# Patient Record
Sex: Male | Born: 1970 | Race: Asian | Hispanic: No | Marital: Married | Smoking: Former smoker
Health system: Southern US, Community
[De-identification: ages and names within clinical notes are randomized; demographics above are authoritative.]

## PROBLEM LIST (undated history)

## (undated) DIAGNOSIS — Z87442 Personal history of urinary calculi: Secondary | ICD-10-CM

## (undated) DIAGNOSIS — G8929 Other chronic pain: Secondary | ICD-10-CM

## (undated) DIAGNOSIS — I10 Essential (primary) hypertension: Secondary | ICD-10-CM

## (undated) DIAGNOSIS — E079 Disorder of thyroid, unspecified: Secondary | ICD-10-CM

## (undated) DIAGNOSIS — E785 Hyperlipidemia, unspecified: Secondary | ICD-10-CM

## (undated) DIAGNOSIS — N201 Calculus of ureter: Secondary | ICD-10-CM

## (undated) DIAGNOSIS — K649 Unspecified hemorrhoids: Secondary | ICD-10-CM

## (undated) DIAGNOSIS — M2550 Pain in unspecified joint: Secondary | ICD-10-CM

## (undated) HISTORY — DX: Hyperlipidemia, unspecified: E78.5

## (undated) HISTORY — PX: FINGER SURGERY: SHX640

---

## 2007-12-21 ENCOUNTER — Ambulatory Visit: Payer: Self-pay | Admitting: Nurse Practitioner

## 2007-12-21 DIAGNOSIS — N200 Calculus of kidney: Secondary | ICD-10-CM | POA: Insufficient documentation

## 2007-12-21 DIAGNOSIS — K649 Unspecified hemorrhoids: Secondary | ICD-10-CM | POA: Insufficient documentation

## 2007-12-21 LAB — CONVERTED CEMR LAB
ALT: 31 units/L (ref 0–53)
Albumin: 4.6 g/dL (ref 3.5–5.2)
Basophils Absolute: 0 10*3/uL (ref 0.0–0.1)
Bilirubin Urine: NEGATIVE
CO2: 26 meq/L (ref 19–32)
Eosinophils Relative: 9 % — ABNORMAL HIGH (ref 0–5)
Glucose, Bld: 77 mg/dL (ref 70–99)
Glucose, Urine, Semiquant: NEGATIVE
HCT: 41.8 % (ref 39.0–52.0)
Lymphocytes Relative: 33 % (ref 12–46)
Lymphs Abs: 1.9 10*3/uL (ref 0.7–4.0)
Neutro Abs: 2.7 10*3/uL (ref 1.7–7.7)
Neutrophils Relative %: 48 % (ref 43–77)
Platelets: 290 10*3/uL (ref 150–400)
Potassium: 4.1 meq/L (ref 3.5–5.3)
Protein, U semiquant: NEGATIVE
RDW: 12.7 % (ref 11.5–15.5)
Sodium: 140 meq/L (ref 135–145)
Specific Gravity, Urine: <1.005
TSH: 3.176 microintl units/mL (ref 0.350–5.50)
Total Bilirubin: 0.7 mg/dL (ref 0.3–1.2)
Total Protein: 8 g/dL (ref 6.0–8.3)
Urobilinogen, UA: 0.2
WBC Urine, dipstick: NEGATIVE
WBC: 5.5 10*3/uL (ref 4.0–10.5)
pH: 6.5

## 2007-12-25 ENCOUNTER — Encounter (INDEPENDENT_AMBULATORY_CARE_PROVIDER_SITE_OTHER): Payer: Self-pay | Admitting: Nurse Practitioner

## 2007-12-26 ENCOUNTER — Ambulatory Visit (HOSPITAL_COMMUNITY): Admission: RE | Admit: 2007-12-26 | Discharge: 2007-12-26 | Payer: Self-pay | Admitting: Internal Medicine

## 2008-01-04 ENCOUNTER — Ambulatory Visit: Payer: Self-pay | Admitting: Nurse Practitioner

## 2008-01-08 ENCOUNTER — Ambulatory Visit (HOSPITAL_COMMUNITY): Admission: RE | Admit: 2008-01-08 | Discharge: 2008-01-08 | Payer: Self-pay | Admitting: Internal Medicine

## 2008-01-12 ENCOUNTER — Encounter (INDEPENDENT_AMBULATORY_CARE_PROVIDER_SITE_OTHER): Payer: Self-pay | Admitting: Nurse Practitioner

## 2008-01-18 ENCOUNTER — Ambulatory Visit: Payer: Self-pay | Admitting: Nurse Practitioner

## 2008-02-01 ENCOUNTER — Encounter (INDEPENDENT_AMBULATORY_CARE_PROVIDER_SITE_OTHER): Payer: Self-pay | Admitting: Nurse Practitioner

## 2008-02-02 ENCOUNTER — Encounter (INDEPENDENT_AMBULATORY_CARE_PROVIDER_SITE_OTHER): Payer: Self-pay | Admitting: Nurse Practitioner

## 2008-02-13 ENCOUNTER — Emergency Department (HOSPITAL_COMMUNITY): Admission: EM | Admit: 2008-02-13 | Discharge: 2008-02-13 | Payer: Self-pay | Admitting: Emergency Medicine

## 2008-02-15 ENCOUNTER — Ambulatory Visit (HOSPITAL_COMMUNITY): Admission: RE | Admit: 2008-02-15 | Discharge: 2008-02-15 | Payer: Self-pay | Admitting: Urology

## 2008-02-15 HISTORY — PX: OTHER SURGICAL HISTORY: SHX169

## 2008-03-19 ENCOUNTER — Ambulatory Visit: Payer: Self-pay | Admitting: Nurse Practitioner

## 2008-03-19 DIAGNOSIS — M25519 Pain in unspecified shoulder: Secondary | ICD-10-CM | POA: Insufficient documentation

## 2008-03-19 DIAGNOSIS — M25539 Pain in unspecified wrist: Secondary | ICD-10-CM

## 2008-03-19 DIAGNOSIS — K623 Rectal prolapse: Secondary | ICD-10-CM

## 2008-04-11 ENCOUNTER — Ambulatory Visit (HOSPITAL_COMMUNITY): Admission: RE | Admit: 2008-04-11 | Discharge: 2008-04-11 | Payer: Self-pay | Admitting: Urology

## 2008-04-19 ENCOUNTER — Ambulatory Visit: Payer: Self-pay | Admitting: Nurse Practitioner

## 2008-04-19 DIAGNOSIS — M6281 Muscle weakness (generalized): Secondary | ICD-10-CM

## 2008-04-19 LAB — CONVERTED CEMR LAB
BUN: 19 mg/dL (ref 6–23)
Basophils Absolute: 0 10*3/uL (ref 0.0–0.1)
Basophils Relative: 0 % (ref 0–1)
CRP: 0.1 mg/dL (ref <0.6)
Creatinine, Ser: 1.08 mg/dL (ref 0.40–1.50)
Eosinophils Relative: 6 % — ABNORMAL HIGH (ref 0–5)
HCT: 43.2 % (ref 39.0–52.0)
Hemoglobin: 14.5 g/dL (ref 13.0–17.0)
MCHC: 33.6 g/dL (ref 30.0–36.0)
Monocytes Absolute: 0.3 10*3/uL (ref 0.1–1.0)
Neutro Abs: 2.3 10*3/uL (ref 1.7–7.7)
Platelets: 288 10*3/uL (ref 150–400)
RDW: 12.7 % (ref 11.5–15.5)
Rhuematoid fact SerPl-aCnc: <20 intl units/mL (ref 0–20)
Sed Rate: 9 mm/hr (ref 0–16)

## 2008-04-21 ENCOUNTER — Ambulatory Visit (HOSPITAL_COMMUNITY): Admission: RE | Admit: 2008-04-21 | Discharge: 2008-04-21 | Payer: Self-pay | Admitting: Internal Medicine

## 2008-05-01 ENCOUNTER — Ambulatory Visit (HOSPITAL_BASED_OUTPATIENT_CLINIC_OR_DEPARTMENT_OTHER): Admission: RE | Admit: 2008-05-01 | Discharge: 2008-05-01 | Payer: Self-pay | Admitting: Urology

## 2008-05-01 HISTORY — PX: OTHER SURGICAL HISTORY: SHX169

## 2008-06-07 ENCOUNTER — Telehealth (INDEPENDENT_AMBULATORY_CARE_PROVIDER_SITE_OTHER): Payer: Self-pay | Admitting: Nurse Practitioner

## 2008-06-07 ENCOUNTER — Encounter (INDEPENDENT_AMBULATORY_CARE_PROVIDER_SITE_OTHER): Payer: Self-pay | Admitting: Nurse Practitioner

## 2008-09-18 ENCOUNTER — Emergency Department (HOSPITAL_COMMUNITY): Admission: EM | Admit: 2008-09-18 | Discharge: 2008-09-18 | Payer: Self-pay | Admitting: Emergency Medicine

## 2008-09-26 ENCOUNTER — Encounter (INDEPENDENT_AMBULATORY_CARE_PROVIDER_SITE_OTHER): Payer: Self-pay | Admitting: Nurse Practitioner

## 2008-10-21 ENCOUNTER — Encounter (INDEPENDENT_AMBULATORY_CARE_PROVIDER_SITE_OTHER): Payer: Self-pay | Admitting: Nurse Practitioner

## 2010-06-20 ENCOUNTER — Encounter (INDEPENDENT_AMBULATORY_CARE_PROVIDER_SITE_OTHER): Payer: Self-pay | Admitting: Internal Medicine

## 2010-12-13 HISTORY — PX: RECTAL PROLAPSE REPAIR, RECTOPEXY: SHX2311

## 2011-01-12 NOTE — Miscellaneous (Signed)
Summary: Rectopexy documentation  Clinical Lists Changes  Observations: Added new observation of PAST SURG HX: 1.  10/16/08:  Rectopexy for rectoprolapse.  Dr. Durenda Hurt, [facility] (06/20/2010 6:19)        Past History:  Past Surgical History: 1.  10/16/08:  Rectopexy for rectoprolapse.  Dr. Durenda Hurt, [facility]

## 2011-01-12 NOTE — Letter (Signed)
Summary: Discharge Summary  Discharge Summary   Imported By: Arta Bruce 08/04/2010 11:31:08  _____________________________________________________________________  External Attachment:    Type:   Image     Comment:   External Document

## 2011-04-27 NOTE — Op Note (Signed)
NAMECHANSON, TEEMS NO.:  [id number]   MEDICAL RECORD NO.:  [id number]          PATIENT TYPE:  AMB   LOCATION:  DAY                          FACILITY:  [facility]   PHYSICIAN:  Valetta Fuller, M.D.  DATE OF BIRTH:  [birth year]   DATE OF PROCEDURE:  DATE OF DISCHARGE:                               OPERATIVE REPORT   PREOPERATIVE DIAGNOSIS:  Bilateral ureteral calculi.   POSTOPERATIVE DIAGNOSIS:  Bilateral ureteral calculi.   PROCEDURE PERFORMED:  Cystoscopy, bilateral retrograde pyelography,  right ureteroscopy, right holmium laser lithotripsy, bilateral double-J  stent placement.   SURGEON:  Dr. Barron Alvine.   ANESTHESIA:  General.   INDICATIONS:  Alan Cervantes is a 40 year old male.  He presented recently  as a consultation from [employer]. He was originally  from the country of [place] and had been living in a refugee camp in  [place]. His English is somewhat limited.  It appears that he has been  having some bilateral flank discomfort for several months now.  The  patient did have a CT scan done about 6-8 weeks ago which showed  multiple right distal ureteral calculi with a stone measuring 7 mm and 3  mm.  There was some dilation, but no evidence of really high-grade  obstruction.  On the left side, the patient appeared to have a partially  duplicated system with a question of a 3-4 mm stone in the left mid  ureter. There also appeared to be some ureteral wall thickening.  The  patient continued to have intermittent complaints of discomfort.  Most  of his pain recently has been on the right side.  We felt that these  stones had been probably present for a long period of time.  We felt  that at this point  it was a prudent to go ahead and intervene with his  situation.  Renal function studies were within normal limits.  The  patient appeared to understand the advantages and disadvantages of this  type of surgery and we felt this was the prudent  thing to do for him.   TECHNIQUE AND FINDINGS:  The patient was brought to the operating room.  Prior to going back, he had told me that he had had to go back to the  emergency room because of some severe right-sided flank discomfort.  He  had successful induction of general endotracheal anesthesia and was  placed in lithotomy position.  He was prepped and draped in the usual  manner.  Cystoscopy revealed an unremarkable urethra, prostatic urethra.  One could see some edema on the right hemi trigone and evidence of some  chronic inflammation.  Retrograde pyelogram was done with an 8-French  cone-tip catheter.  Fluoroscopic guidance and interpretation was  utilized.  One could see multiple filling defects in the distal right  ureter with dilation proximal to that and a solitary system on that  side.  A guidewire was placed up to the right renal pelvis.  A short  rigid ureteroscope was then engaged in the distal ureter without the  need for dilation.  A  large stone was encountered which appeared to be  impacted in the intramural ureter.  Holmium laser lithotriptor was used  to break the stone into approximately a dozen pieces which were all  flushed from the distal ureter.  A smaller 3-4 mm stone was also  encountered which also underwent laser lithotripsy.  At the completion  of the procedure, there was quite a bit of mucosal edema in the area  where the stone had been impacted but no residual fragments.  A 6-French  24 cm stent was then placed with fluoroscopic as well as visual  guidance.   Attention was then turned towards the left side.  Left retrograde  pyelogram was performed.  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  I saw no  evidence of any high-grade obstruction.  In the mid to proximal ureter  in the upper pole moiety of the duplicated system, there was a small  filling defect with a questionable 3 mm stone in  that location.  The  ureter otherwise appeared to be quite delicate.  I felt this stone would  probably be best treated with stent placement to allow for some natural  dilation and then hopefully spontaneous passage given his relatively  small size but upper location especially given the duplicated system.  A  guidewire was placed up to the upper pole moiety and another 6-French 24  cm double-J stent was placed.  Our plan would be to leave those  indwelling for about 2 weeks and then removed both stents.  Again the  right side appears to be cleared of stones, in the left there may still  be a small stone in the proximal ureter.  The patient was brought to the  recovery room in stable condition without any obvious complications or  problems.           ______________________________  Valetta Fuller, M.D.  Electronically Signed     DSG/MEDQ  D:  02/15/2008  T:  02/15/2008  Job:  [id number]   cc:   Melvern Banker  Fax: [phone]

## 2011-04-27 NOTE — Op Note (Signed)
NAMELAM, MCCUBBINS NO.:  [id number]   MEDICAL RECORD NO.:  [id number]          PATIENT TYPE:  AMB   LOCATION:  [facility]                         [facility]AC[facility]   PHYSICIAN:  Valetta Fuller, M.D.  DATE OF BIRTH:  06/07/1971   DATE OF PROCEDURE:  05/01/2008  DATE OF DISCHARGE:                               OPERATIVE REPORT   PREOPERATIVE DIAGNOSIS:  Is a left ureteral calculus.   POSTOPERATIVE DIAGNOSIS:  Is a left ureteral calculus.   PROCEDURE PERFORMED:  Was cystoscopy, left retrograde pyelogram, left  ureteroscopy with stone basketing.   SURGEON:  Valetta Fuller, M.D.   ANESTHESIA:  General.   INDICATION:  The patient has had a complicated urologic history over the  last several weeks last month.  The patient had presented with multiple  stones in his right distal ureter and a fair amount of hydronephrosis on  the right side.  Previous imaging had also raised a question of a  duplicated left collecting system with a left-sided ureteral stone as  well.  The patient was taken to surgery several weeks back and had  successful ureteroscopy with laser treatment of his right distal  ureteral calculi.  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  The patient subsequently came back for follow-up and was doing well  clinically.  He was, however, complaining of some ongoing intermittent  left-sided flank discomfort.  For that reason, he had a repeat CT scan  performed.  This showed resolution of the right-sided calculi.  The  previous noted right hydronephrosis had resolved.  The patient did,  however, have continued presence of a small stone in his left ureter and  also a small stone in the lower pole of his left kidney.  At this point,  the patient was felt to require intervention since a sufficient amount  of time  had passed for spontaneous passage.  The patient elected to  proceed with intervention, and full informed consent was obtained.   TECHNIQUE AND FINDINGS:  The patient was brought to the operating room  where he had successful induction of general anesthesia.  He was placed  in lithotomy position and prepped and draped in usual manner.  Cystoscopy revealed unremarkable anterior and prostatic urethra as well  as bladder.   Retrograde pyelogram was done with an 8-French cone-tip catheter with  fluoroscopic interpretation.  The patient again was noted to have a  partially duplicated system approximately half way up the ureter.  I  could see no definitive evidence of high-grade obstruction or obvious  filling defects.  At the completion of the retrograde pyelogram, a  guidewire was placed up to the left renal pelvis.   The cystoscope was removed, and a rigid ureteroscope was engaged in the  distal ureter without the need for any dilation.  Halfway up the ureter,  there was a bifurcation.  Ureteroscopy of both moieties was performed.  We did find a  somewhat impacted 4 mm stone in what appeared to be the  upper pole moiety of that kidney.  This was then basket extracted  without difficulty.  There did not appear to be a severe amount of  ureteral edema, and the entire  procedure was done quite atraumatically.  We felt that there was no  definitive need for double-J stent placement.  The patient had lidocaine  jelly instilled per urethra.  He appeared to tolerate the procedure  well.  There were no obvious complications, and he was brought to  recovery room in stable condition.           ______________________________  Valetta Fuller, M.D.  Electronically Signed     DSG/MEDQ  D:  05/01/2008  T:  05/01/2008  Job:  [id number]

## 2011-09-08 LAB — POCT HEMOGLOBIN-HEMACUE: Hemoglobin: 15.4

## 2011-09-13 LAB — COMPREHENSIVE METABOLIC PANEL
ALT: 21
AST: 22
Albumin: 4.2
Chloride: 103
Creatinine, Ser: 1
Potassium: 4
Sodium: 138
Total Bilirubin: 0.8

## 2011-09-13 LAB — MALARIA SMEAR

## 2011-09-13 LAB — CBC
HCT: 44
Platelets: 248
RDW: 12.9

## 2011-09-13 LAB — DIFFERENTIAL
Basophils Relative: 1
Monocytes Absolute: 0.4
Monocytes Relative: 8
Neutro Abs: 2.9

## 2011-09-13 LAB — CULTURE, BLOOD (ROUTINE X 2): Culture: NO GROWTH

## 2013-08-05 ENCOUNTER — Encounter (HOSPITAL_COMMUNITY): Payer: Self-pay | Admitting: Emergency Medicine

## 2013-08-05 ENCOUNTER — Emergency Department (INDEPENDENT_AMBULATORY_CARE_PROVIDER_SITE_OTHER)
Admission: EM | Admit: 2013-08-05 | Discharge: 2013-08-05 | Disposition: A | Discharge status: Home / Self Care | Payer: Self-pay | Source: Home / Self Care | Attending: Emergency Medicine | Admitting: Emergency Medicine

## 2013-08-05 DIAGNOSIS — J029 Acute pharyngitis, unspecified: Secondary | ICD-10-CM

## 2013-08-05 HISTORY — DX: Other chronic pain: G89.29

## 2013-08-05 HISTORY — DX: Pain in unspecified joint: M25.50

## 2013-08-05 LAB — POCT RAPID STREP A: Streptococcus, Group A Screen (Direct): NEGATIVE

## 2013-08-05 MED ORDER — HYDROCODONE-ACETAMINOPHEN 5-325 MG PO TABS: 2.0000 | ORAL_TABLET | ORAL | Status: DC | PRN

## 2013-08-05 MED ORDER — PENICILLIN V POTASSIUM 500 MG PO TABS: 500.0000 mg | ORAL_TABLET | Freq: Four times a day (QID) | ORAL | Status: AC

## 2013-08-05 NOTE — ED Provider Notes (Signed)
Medical screening examination/treatment/procedure(s) were performed by non-physician practitioner and as supervising physician I was immediately available for consultation/collaboration.  Leslee Home, M.D.  Reuben Likes, MD 08/05/13 [phone]

## 2013-08-05 NOTE — ED Notes (Signed)
Reports sore throat and fever since friday

## 2013-08-05 NOTE — ED Provider Notes (Signed)
  CSN: [id number]     Arrival date & time 08/05/13  4098 History     None    Chief Complaint  Patient presents with  . Fever  . Sore Throat   (Consider location/radiation/quality/duration/timing/severity/associated sxs/prior Treatment) Patient is a 42 y.o. male presenting with fever and pharyngitis. The history is provided by the patient. No language interpreter was used.  Fever Severity:  Moderate Onset quality:  Sudden Timing:  Constant Progression:  Worsening Chronicity:  New Relieved by:  Nothing Worsened by:  Nothing tried Ineffective treatments:  None tried Associated symptoms: no chest pain   Sore Throat Pertinent negatives include no chest pain.  Pt complains of a sorethroat.  Pt has trouble swallowing.    Past Medical History  Diagnosis Date  . Malaria   . Chronic joint pain    Past Surgical History  Procedure Laterality Date  . Hemorrhoid surgery    . Kidney stone surgery     No family history on file. History  Substance Use Topics  . Smoking status: Current Some Day Smoker  . Smokeless tobacco: Not on file  . Alcohol Use: Yes    Review of Systems  Constitutional: Positive for fever.  Cardiovascular: Negative for chest pain.  All other systems reviewed and are negative.    Allergies  Review of patient's allergies indicates no known allergies.  Home Medications   Current Outpatient Rx  Name  Route  Sig  Dispense  Refill  . ibuprofen (ADVIL,MOTRIN) 200 MG tablet   Oral   Take 200 mg by mouth every 6 (six) hours as needed for pain.          BP 142/90  Pulse 83  Temp(Src) 98.8 F (37.1 C) (Oral)  SpO2 98% Physical Exam  Nursing note and vitals reviewed. Constitutional: He is oriented to person, place, and time. He appears well-developed and well-nourished.  HENT:  Head: Normocephalic and atraumatic.  Right Ear: External ear normal.  Left Ear: External ear normal.  Nose: Nose normal.  Mouth/Throat: Oropharynx is clear and moist.   Eyes: EOM are normal. Pupils are equal, round, and reactive to light.  Neck: Normal range of motion.  Cardiovascular: Normal rate.   Pulmonary/Chest: Effort normal.  Musculoskeletal: Normal range of motion.  Neurological: He is alert and oriented to person, place, and time. He has normal reflexes.  Skin: Skin is warm.  Psychiatric: He has a normal mood and affect.    ED Course   Procedures (including critical care time)  Labs Reviewed - No data to display No results found. No diagnosis found.  MDM  [id number]  qid and hydrocodone   Lonia Skinner [place], PA-C 08/05/13 1106

## 2013-08-07 LAB — CULTURE, GROUP A STREP

## 2014-02-16 ENCOUNTER — Emergency Department (INDEPENDENT_AMBULATORY_CARE_PROVIDER_SITE_OTHER)
Admission: EM | Admit: 2014-02-16 | Discharge: 2014-02-16 | Disposition: A | Discharge status: Home / Self Care | Payer: Self-pay | Source: Home / Self Care | Attending: Family Medicine | Admitting: Family Medicine

## 2014-02-16 ENCOUNTER — Encounter (HOSPITAL_COMMUNITY): Payer: Self-pay | Admitting: Emergency Medicine

## 2014-02-16 DIAGNOSIS — J329 Chronic sinusitis, unspecified: Secondary | ICD-10-CM

## 2014-02-16 LAB — POCT RAPID STREP A: Streptococcus, Group A Screen (Direct): NEGATIVE

## 2014-02-16 MED ORDER — FLUTICASONE PROPIONATE 50 MCG/ACT NA SUSP: 2.0000 | Freq: Two times a day (BID) | NASAL | Status: DC

## 2014-02-16 MED ORDER — PREDNISONE 10 MG PO TABS: ORAL_TABLET | ORAL | Status: DC

## 2014-02-16 MED ORDER — AMOXICILLIN-POT CLAVULANATE 875-125 MG PO TABS: 1.0000 | ORAL_TABLET | Freq: Two times a day (BID) | ORAL | Status: DC

## 2014-02-16 NOTE — Discharge Instructions (Signed)

## 2014-02-16 NOTE — ED Provider Notes (Signed)
CSN: [id number]     Arrival date & time 02/16/14  0930 History   First MD Initiated Contact with Patient 02/16/14 [phone]     Chief Complaint  Patient presents with  . Sore Throat   (Consider location/radiation/quality/duration/timing/severity/associated sxs/prior Treatment) HPI Comments: 43 year old male presents complaining of sore throat, fever, nasal congestion, cough. He started with the coughing, nasal congestion, runny nose, and sinus pressure approximately 2 weeks ago. The sore throat just started 2 days ago and has been getting progressively worse. He has been taking over-the-counter medications without relief. The fever has been subjective, not measured. No NVD, shortness of breath, pleuritic chest pain. No recent travel or sick contacts.  Patient is a 43 y.o. male presenting with pharyngitis.  Sore Throat Pertinent negatives include no chest pain, no abdominal pain and no shortness of breath.    Past Medical History  Diagnosis Date  . Malaria   . Chronic joint pain    Past Surgical History  Procedure Laterality Date  . Hemorrhoid surgery    . Kidney stone surgery     History reviewed. No pertinent family history. History  Substance Use Topics  . Smoking status: Current Some Day Smoker  . Smokeless tobacco: Not on file  . Alcohol Use: Yes    Review of Systems  Constitutional: Positive for fever. Negative for fatigue.  HENT: Positive for congestion, postnasal drip, rhinorrhea, sinus pressure and sore throat. Negative for ear pain.   Eyes: Negative for visual disturbance.  Respiratory: Positive for cough. Negative for shortness of breath.   Cardiovascular: Negative for chest pain, palpitations and leg swelling.  Gastrointestinal: Negative for nausea, vomiting, abdominal pain, diarrhea and constipation.  Genitourinary: Negative for dysuria, urgency, frequency and hematuria.  Musculoskeletal: Negative for arthralgias, myalgias, neck pain and neck stiffness.  Skin: Negative  for rash.  Neurological: Negative for dizziness, weakness and light-headedness.    Allergies  Review of patient's allergies indicates no known allergies.  Home Medications   Current Outpatient Rx  Name  Route  Sig  Dispense  Refill  . ibuprofen (ADVIL,MOTRIN) 200 MG tablet   Oral   Take 200 mg by mouth every 6 (six) hours as needed for pain.         Marland Kitchen amoxicillin-clavulanate (AUGMENTIN) 875-125 MG per tablet   Oral   Take 1 tablet by mouth every 12 (twelve) hours.   14 tablet   0   . fluticasone (FLONASE) 50 MCG/ACT nasal spray   Each Nare   Place 2 sprays into both nostrils 2 (two) times daily.   16 g   2   . HYDROcodone-acetaminophen (NORCO/VICODIN) 5-325 MG per tablet   Oral   Take 2 tablets by mouth every 4 (four) hours as needed for pain.   10 tablet   0   . predniSONE (DELTASONE) 10 MG tablet      4 tabs PO QD for 4 days; 3 tabs PO QD for 3 days; 2 tabs PO QD for 2 days; 1 tab PO QD for 1 day   30 tablet   0    BP 138/98  Pulse 78  Temp(Src) 98.5 F (36.9 C) (Oral)  Resp 16  SpO2 100% Physical Exam  Nursing note and vitals reviewed. Constitutional: He is oriented to person, place, and time. He appears well-developed and well-nourished. No distress.  HENT:  Head: Normocephalic and atraumatic.  Right Ear: Tympanic membrane, external ear and ear canal normal.  Left Ear: External ear and ear canal normal. Tympanic  membrane is injected.  Nose: Mucosal edema and rhinorrhea present. Right sinus exhibits frontal sinus tenderness. Right sinus exhibits no maxillary sinus tenderness. Left sinus exhibits frontal sinus tenderness. Left sinus exhibits no maxillary sinus tenderness.  Mouth/Throat: Uvula is midline, oropharynx is clear and moist and mucous membranes are normal.  Neck: Normal range of motion. Neck supple.  Cardiovascular: Normal rate, regular rhythm and normal heart sounds.  Exam reveals no gallop and no friction rub.   No murmur  heard. Pulmonary/Chest: Effort normal and breath sounds normal. No respiratory distress. He has no wheezes. He has no rales.  Lymphadenopathy:       Head (left side): Occipital adenopathy present.    He has cervical adenopathy.       Right cervical: Superficial cervical adenopathy present.       Left cervical: Superficial cervical adenopathy present.  Neurological: He is alert and oriented to person, place, and time. Coordination normal.  Skin: Skin is warm and dry. No rash noted. He is not diaphoretic.  Psychiatric: He has a normal mood and affect. Judgment normal.    ED Course  Procedures (including critical care time) Labs Review Labs Reviewed  POCT RAPID STREP A ([facility] CARE ONLY)   Imaging Review No results found.   MDM   1. Sinusitis    Probable sinusitis versus uri.  Symptoms going on for 2 weeks and worsening so will treat with ABx.  Also treat with prednisone and flonase, continue OTC meds PRN.  F/u if not improving.    Meds ordered this encounter  Medications  . amoxicillin-clavulanate (AUGMENTIN) 875-125 MG per tablet    Sig: Take 1 tablet by mouth every 12 (twelve) hours.    Dispense:  14 tablet    Refill:  0    Order Specific Question:  Supervising Provider    Answer:  Linna HoffKINDL, JAMES D [phone][[id number]]  . predniSONE (DELTASONE) 10 MG tablet    Sig: 4 tabs PO QD for 4 days; 3 tabs PO QD for 3 days; 2 tabs PO QD for 2 days; 1 tab PO QD for 1 day    Dispense:  30 tablet    Refill:  0    Order Specific Question:  Supervising Provider    Answer:  Linna HoffKINDL, JAMES D [phone][[id number]]  . fluticasone (FLONASE) 50 MCG/ACT nasal spray    Sig: Place 2 sprays into both nostrils 2 (two) times daily.    Dispense:  16 g    Refill:  2    Order Specific Question:  Supervising Provider    Answer:  Bradd CanaryKINDL, JAMES D [[id number]]       Graylon GoodZachary H Giorgi Debruin, PA-C 02/16/14 1034

## 2014-02-16 NOTE — ED Provider Notes (Signed)
Medical screening examination/treatment/procedure(s) were performed by resident physician or non-physician practitioner and as supervising physician I was immediately available for consultation/collaboration.   Roberts Bon DOUGLAS MD.   Myan Locatelli D Jakaylah Schlafer, MD 02/16/14 1453 

## 2014-02-16 NOTE — ED Notes (Signed)
C/o  Severe sore throat x 2 days.  States had cold symptoms prior:  Cough.  Fever.  Stuffy nose.  Headaches.   Denies n/v/d.   Mild relief with otc meds.

## 2014-02-18 LAB — CULTURE, GROUP A STREP

## 2014-04-24 ENCOUNTER — Emergency Department (INDEPENDENT_AMBULATORY_CARE_PROVIDER_SITE_OTHER)
Admission: EM | Admit: 2014-04-24 | Discharge: 2014-04-24 | Disposition: A | Discharge status: Home / Self Care | Payer: Self-pay | Source: Home / Self Care | Attending: Emergency Medicine | Admitting: Emergency Medicine

## 2014-04-24 ENCOUNTER — Encounter (HOSPITAL_COMMUNITY): Payer: Self-pay | Admitting: Emergency Medicine

## 2014-04-24 DIAGNOSIS — K121 Other forms of stomatitis: Secondary | ICD-10-CM

## 2014-04-24 DIAGNOSIS — K1370 Unspecified lesions of oral mucosa: Secondary | ICD-10-CM

## 2014-04-24 DIAGNOSIS — J029 Acute pharyngitis, unspecified: Secondary | ICD-10-CM

## 2014-04-24 LAB — POCT RAPID STREP A: STREPTOCOCCUS, GROUP A SCREEN (DIRECT): NEGATIVE

## 2014-04-24 MED ORDER — CETIRIZINE HCL 10 MG PO TABS: 10.0000 mg | ORAL_TABLET | Freq: Every day | ORAL | Status: DC

## 2014-04-24 MED ORDER — MAGIC MOUTHWASH W/LIDOCAINE: 5.0000 mL | Freq: Three times a day (TID) | ORAL | Status: DC | PRN

## 2014-04-24 NOTE — ED Notes (Signed)
Pt    Reports  Symptoms  Of  sorethroat  woith  Pain  When  He  Swallows  With  Some  Swelling to  l  Side  Neck         Pt  Sitting upright on the  Exam table  Speaking in  Complete  sentances

## 2014-04-24 NOTE — ED Provider Notes (Signed)
CSN: [id number]     Arrival date & time 04/24/14  0807 History   First MD Initiated Contact with Patient 04/24/14 [id number]     No chief complaint on file.  (Consider location/radiation/quality/duration/timing/severity/associated sxs/prior Treatment) Patient is a 43 y.o. male presenting with pharyngitis. The history is provided by the patient.  Sore Throat This is a new problem. The current episode started more than 2 days ago. The problem occurs constantly. The problem has been gradually worsening. Associated symptoms include headaches. Pertinent negatives include no chest pain, no abdominal pain and no shortness of breath. The symptoms are aggravated by eating. Nothing relieves the symptoms. He has tried nothing for the symptoms.   Roselee NovaBirkha Ledee is a 43 y.o. male who presents to the ED with sore throat and swollen glands that started 5 days ago. The symptoms have gotten worse. He denies fever or chills, nausea or vomiting. He has had nasal congestion in the mornings.   Past Medical History  Diagnosis Date  . Malaria   . Chronic joint pain    Past Surgical History  Procedure Laterality Date  . Hemorrhoid surgery    . Kidney stone surgery     No family history on file. History  Substance Use Topics  . Smoking status: Current Some Day Smoker  . Smokeless tobacco: Not on file  . Alcohol Use: Yes    Review of Systems  Constitutional: Negative for fever and chills.  HENT: Positive for ear pain, rhinorrhea and sore throat. Negative for dental problem, sinus pressure and trouble swallowing.   Respiratory: Negative for shortness of breath.   Cardiovascular: Negative for chest pain.  Gastrointestinal: Negative for nausea, vomiting and abdominal pain.  Genitourinary: Negative for dysuria, urgency and frequency.  Musculoskeletal: Positive for myalgias. Negative for back pain.  Skin: Negative for rash.  Neurological: Positive for headaches.  Hematological: Positive for adenopathy.   Psychiatric/Behavioral: Negative for confusion. The patient is not nervous/anxious.     Allergies  Review of patient's allergies indicates no known allergies.  Home Medications   Prior to Admission medications   Medication Sig Start Date End Date Taking? Authorizing Provider  amoxicillin-clavulanate (AUGMENTIN) 875-125 MG per tablet Take 1 tablet by mouth every 12 (twelve) hours. 02/16/14   Adrian BlackwaterZachary H Baker, PA-C  fluticasone (FLONASE) 50 MCG/ACT nasal spray Place 2 sprays into both nostrils 2 (two) times daily. 02/16/14   Graylon GoodZachary H Baker, PA-C  HYDROcodone-acetaminophen (NORCO/VICODIN) 5-325 MG per tablet Take 2 tablets by mouth every 4 (four) hours as needed for pain. 08/05/13   Elson AreasLeslie K Sofia, PA-C  ibuprofen (ADVIL,MOTRIN) 200 MG tablet Take 200 mg by mouth every 6 (six) hours as needed for pain.    Historical Provider, MD  predniSONE (DELTASONE) 10 MG tablet 4 tabs PO QD for 4 days; 3 tabs PO QD for 3 days; 2 tabs PO QD for 2 days; 1 tab PO QD for 1 day 02/16/14   Graylon GoodZachary H Baker, PA-C   BP 134/89  Pulse 62  Temp(Src) 98.5 F (36.9 C) (Oral)  Resp 16  SpO2 98% Physical Exam  Nursing note and vitals reviewed. Constitutional: He is oriented to person, place, and time. He appears well-developed and well-nourished. No distress.  HENT:  Head: Normocephalic.  Right Ear: Tympanic membrane normal.  Left Ear: Tympanic membrane normal.  Nose: Nose normal.  Mouth/Throat: Uvula is midline and mucous membranes are normal. Posterior oropharyngeal erythema present.  Small ulcer area right posterior pharynx  Eyes: Conjunctivae and EOM are normal.  Neck: Neck supple.  Cardiovascular: Normal rate.   Pulmonary/Chest: Effort normal and breath sounds normal.  Abdominal: Soft. There is no tenderness.  Musculoskeletal: Normal range of motion.  Lymphadenopathy:    He has no cervical adenopathy.  Neurological: He is alert and oriented to person, place, and time. No cranial nerve deficit.  Skin: Skin  is warm and dry.  Psychiatric: He has a normal mood and affect. His behavior is normal.    ED Course  Procedures Results for orders placed during the hospital encounter of 04/24/14 (from the past 24 hour(s))  POCT RAPID STREP A ([facility] URG CARE ONLY)     Status: None   Collection Time    04/24/14  9:02 AM      Result Value Ref Range   Streptococcus, Group A Screen (Direct) NEGATIVE  NEGATIVE     MDM  43 y.o. male with sore throat. Will treat for viral pharyngitis and oral ulcer. Stable for discharge without fever, neck pain or difficulty swallowing. Discussed with the patient clinical findings and plan of care and all questioned fully answered. He will return if any problems arise.    Medication List    TAKE these medications       cetirizine 10 MG tablet  Commonly known as:  ZYRTEC  Take 1 tablet (10 mg total) by mouth daily.     magic mouthwash w/lidocaine Soln  Take 5 mLs by mouth 3 (three) times daily as needed for mouth pain.      ASK your doctor about these medications       amoxicillin-clavulanate 875-125 MG per tablet  Commonly known as:  AUGMENTIN  Take 1 tablet by mouth every 12 (twelve) hours.     fluticasone 50 MCG/ACT nasal spray  Commonly known as:  FLONASE  Place 2 sprays into both nostrils 2 (two) times daily.     HYDROcodone-acetaminophen 5-325 MG per tablet  Commonly known as:  NORCO/VICODIN  Take 2 tablets by mouth every 4 (four) hours as needed for pain.     ibuprofen 200 MG tablet  Commonly known as:  ADVIL,MOTRIN  Take 200 mg by mouth every 6 (six) hours as needed for pain.     predniSONE 10 MG tablet  Commonly known as:  DELTASONE  4 tabs PO QD for 4 days; 3 tabs PO QD for 3 days; 2 tabs PO QD for 2 days; 1 tab PO QD for 1 day           [facility] Tynetta Bachmann, NP 04/24/14 1326

## 2014-04-26 LAB — CULTURE, GROUP A STREP

## 2014-04-27 NOTE — ED Provider Notes (Signed)
Medical screening examination/treatment/procedure(s) were performed by non-physician practitioner and as supervising physician I was immediately available for consultation/collaboration.  Thailyn Khalid, M.D.  [facility], MD 04/27/14 0816 

## 2015-03-22 ENCOUNTER — Emergency Department (HOSPITAL_COMMUNITY)
Admission: EM | Admit: 2015-03-22 | Discharge: 2015-03-23 | Disposition: A | Discharge status: Home / Self Care | Payer: Self-pay | Attending: Emergency Medicine | Admitting: Emergency Medicine

## 2015-03-22 ENCOUNTER — Encounter (HOSPITAL_COMMUNITY): Payer: Self-pay | Admitting: Emergency Medicine

## 2015-03-22 ENCOUNTER — Emergency Department (HOSPITAL_COMMUNITY): Payer: Self-pay

## 2015-03-22 DIAGNOSIS — R109 Unspecified abdominal pain: Secondary | ICD-10-CM

## 2015-03-22 DIAGNOSIS — Z720 Tobacco use: Secondary | ICD-10-CM | POA: Insufficient documentation

## 2015-03-22 DIAGNOSIS — N201 Calculus of ureter: Secondary | ICD-10-CM

## 2015-03-22 DIAGNOSIS — Z8613 Personal history of malaria: Secondary | ICD-10-CM | POA: Insufficient documentation

## 2015-03-22 DIAGNOSIS — Z87442 Personal history of urinary calculi: Secondary | ICD-10-CM

## 2015-03-22 DIAGNOSIS — G8929 Other chronic pain: Secondary | ICD-10-CM | POA: Insufficient documentation

## 2015-03-22 DIAGNOSIS — Z79899 Other long term (current) drug therapy: Secondary | ICD-10-CM | POA: Insufficient documentation

## 2015-03-22 DIAGNOSIS — Z7951 Long term (current) use of inhaled steroids: Secondary | ICD-10-CM | POA: Insufficient documentation

## 2015-03-22 DIAGNOSIS — N202 Calculus of kidney with calculus of ureter: Secondary | ICD-10-CM | POA: Insufficient documentation

## 2015-03-22 LAB — URINALYSIS, ROUTINE W REFLEX MICROSCOPIC
Bilirubin Urine: NEGATIVE
Glucose, UA: NEGATIVE mg/dL
Ketones, ur: NEGATIVE mg/dL
NITRITE: NEGATIVE
PROTEIN: 30 mg/dL — AB
Specific Gravity, Urine: 1.015 (ref 1.005–1.030)
UROBILINOGEN UA: 1 mg/dL (ref 0.0–1.0)
pH: 7 (ref 5.0–8.0)

## 2015-03-22 LAB — CBC WITH DIFFERENTIAL/PLATELET
BASOS ABS: 0 10*3/uL (ref 0.0–0.1)
Basophils Relative: 0 % (ref 0–1)
EOS PCT: 3 % (ref 0–5)
Eosinophils Absolute: 0.3 10*3/uL (ref 0.0–0.7)
HCT: 46.8 % (ref 39.0–52.0)
HEMOGLOBIN: 16.3 g/dL (ref 13.0–17.0)
Lymphocytes Relative: 18 % (ref 12–46)
Lymphs Abs: 1.6 10*3/uL (ref 0.7–4.0)
MCH: 30.9 pg (ref 26.0–34.0)
MCHC: 34.8 g/dL (ref 30.0–36.0)
MCV: 88.6 fL (ref 78.0–100.0)
MONO ABS: 0.5 10*3/uL (ref 0.1–1.0)
MONOS PCT: 6 % (ref 3–12)
Neutro Abs: 6.3 10*3/uL (ref 1.7–7.7)
Neutrophils Relative %: 73 % (ref 43–77)
Platelets: 233 10*3/uL (ref 150–400)
RBC: 5.28 MIL/uL (ref 4.22–5.81)
RDW: 12.7 % (ref 11.5–15.5)
WBC: 8.7 10*3/uL (ref 4.0–10.5)

## 2015-03-22 LAB — COMPREHENSIVE METABOLIC PANEL
ALBUMIN: 4.4 g/dL (ref 3.5–5.2)
ALT: 25 U/L (ref 0–53)
AST: 23 U/L (ref 0–37)
Alkaline Phosphatase: 69 U/L (ref 39–117)
Anion gap: 8 (ref 5–15)
BUN: 14 mg/dL (ref 6–23)
CHLORIDE: 101 mmol/L (ref 96–112)
CO2: 29 mmol/L (ref 19–32)
Calcium: 9.4 mg/dL (ref 8.4–10.5)
Creatinine, Ser: 1.2 mg/dL (ref 0.50–1.35)
GFR calc non Af Amer: 71 mL/min — ABNORMAL LOW (ref >90)
GFR, EST AFRICAN AMERICAN: 82 mL/min — AB (ref >90)
Glucose, Bld: 92 mg/dL (ref 70–99)
Potassium: 3.2 mmol/L — ABNORMAL LOW (ref 3.5–5.1)
Sodium: 138 mmol/L (ref 135–145)
Total Bilirubin: 1 mg/dL (ref 0.3–1.2)
Total Protein: 7.9 g/dL (ref 6.0–8.3)

## 2015-03-22 LAB — LIPASE, BLOOD: Lipase: 32 U/L (ref 11–59)

## 2015-03-22 LAB — URINE MICROSCOPIC-ADD ON

## 2015-03-22 MED ORDER — FENTANYL CITRATE 0.05 MG/ML IJ SOLN
50.0000 ug | Freq: Once | INTRAMUSCULAR | Status: AC
  Administered 2015-03-22: 50 ug via INTRAVENOUS
  Filled 2015-03-22: qty 2

## 2015-03-22 MED ORDER — ONDANSETRON HCL 4 MG/2ML IJ SOLN
4.0000 mg | Freq: Once | INTRAMUSCULAR | Status: AC
  Administered 2015-03-22: 4 mg via INTRAVENOUS
  Filled 2015-03-22: qty 2

## 2015-03-22 NOTE — ED Notes (Signed)
Patient presents for left flank pain, onset today, 10/10 pain, denies urinary symptoms, nauseated. Hx of kidney stones.

## 2015-03-22 NOTE — ED Notes (Signed)
Patient transported to CT 

## 2015-03-22 NOTE — ED Notes (Addendum)
Awake. Verbally responsive. Resp even and unlabored. No audible adventitious breath sounds noted. ABC's intact. Pt reported having Lt flank pain radiating to LLQ. Reported having nausea without emesis. Family at bedside.

## 2015-03-23 MED ORDER — SODIUM CHLORIDE 0.9 % IV BOLUS (SEPSIS)
500.0000 mL | Freq: Once | INTRAVENOUS | Status: AC
  Administered 2015-03-23: 500 mL via INTRAVENOUS

## 2015-03-23 MED ORDER — HYDROMORPHONE HCL 1 MG/ML IJ SOLN
1.0000 mg | Freq: Once | INTRAMUSCULAR | Status: DC
  Filled 2015-03-23: qty 1

## 2015-03-23 MED ORDER — CIPROFLOXACIN HCL 500 MG PO TABS: 500.0000 mg | ORAL_TABLET | Freq: Two times a day (BID) | ORAL | Status: DC

## 2015-03-23 MED ORDER — ONDANSETRON HCL 4 MG PO TABS: 4.0000 mg | ORAL_TABLET | Freq: Three times a day (TID) | ORAL | Status: DC | PRN

## 2015-03-23 MED ORDER — OXYCODONE-ACETAMINOPHEN 5-325 MG PO TABS: ORAL_TABLET | ORAL | Status: DC

## 2015-03-23 MED ORDER — CEFTRIAXONE SODIUM 1 G IJ SOLR
1.0000 g | Freq: Once | INTRAMUSCULAR | Status: AC
  Administered 2015-03-23: 1 g via INTRAVENOUS
  Filled 2015-03-23: qty 10

## 2015-03-23 NOTE — Discharge Instructions (Signed)
You have a 14 mm stone that is stuck in your left ureter just below the kidney. Call Dr Belva Crome office in the morning to get an appointment to be seen on Monday, April 11th. Do not eat or drink after Midnight on Sunday night so you can have lithotripsy done to break up the stone. Return to the ED if you get worse, such as fever, vomiting or severe pain not controlled by the pain medications. Take the antibiotic because you have a mild infection in your urine.     Kidney Stones Kidney stones (urolithiasis) are solid masses that form inside your kidneys. The intense pain is caused by the stone moving through the kidney, ureter, bladder, and urethra (urinary tract). When the stone moves, the ureter starts to spasm around the stone. The stone is usually passed in your pee (urine).  HOME CARE  Drink enough fluids to keep your pee clear or pale yellow. This helps to get the stone out.  Strain all pee through the provided strainer. Do not pee without peeing through the strainer, not even once. If you pee the stone out, catch it in the strainer. The stone may be as small as a grain of salt. Take this to your doctor. This will help your doctor figure out what you can do to try to prevent more kidney stones.  Only take medicine as told by your doctor.  Follow up with your doctor as told.  Get follow-up X-rays as told by your doctor. GET HELP IF: You have pain that gets worse even if you have been taking pain medicine. GET HELP RIGHT AWAY IF:   Your pain does not get better with medicine.  You have a fever or shaking chills.  Your pain increases and gets worse over 18 hours.  You have new belly (abdominal) pain.  You feel faint or pass out.  You are unable to pee. MAKE SURE YOU:   Understand these instructions.  Will watch your condition.  Will get help right away if you are not doing well or get worse. Document Released: 05/17/2008 Document Revised: 08/01/2013 Document Reviewed:  05/02/2013 [facility] Patient Information 2015 [place], [place]. This information is not intended to replace advice given to you by your health care provider. Make sure you discuss any questions you have with your health care provider.  Lithotripsy for Kidney Stones Lithotripsy is a treatment that can sometimes help eliminate kidney stones and pain that they cause. A form of lithotripsy, also known as extracorporeal shock wave lithotripsy, is a nonsurgical procedure that helps your body rid itself of the kidney stone when it is too big to pass on its own. Extracorporeal shock wave lithotripsy is a method of crushing a kidney stone with shock waves. These shock waves pass through your body and are focused on your stone. They cause the kidney stones to crumble while still in the urinary tract. It is then easier for the smaller pieces of stone to pass in the urine. Lithotripsy usually takes about an hour. It is done in a hospital, a lithotripsy center, or a mobile unit. It usually does not require an overnight stay. Your health care provider will instruct you on preparation for the procedure. Your health care provider will tell you what to expect afterward. LET [facility] CARE PROVIDER KNOW ABOUT:  Any allergies you have.  All medicines you are taking, including vitamins, herbs, eye drops, creams, and over-the-counter medicines.  Previous problems you or members of your family have had  with the use of anesthetics.  Any blood disorders you have.  Previous surgeries you have had.  Medical conditions you have. RISKS AND COMPLICATIONS Generally, lithotripsy for kidney stones is a safe procedure. However, as with any procedure, complications can occur. Possible complications include:  Infection.  Bleeding of the kidney.  Bruising of the kidney or skin.  Obstruction of the ureter.  Failure of the stone to fragment. BEFORE THE PROCEDURE  Do not eat or drink for 6-8 hours prior to the procedure.  You may, however, take the medications with a sip of water that your physician instructs you to take  Do not take aspirin or aspirin-containing products for 7 days prior to your procedure  Do not take nonsteroidal anti-inflammatory products for 7 days prior to your procedure PROCEDURE A stent (flexible tube with holes) may be placed in your ureter. The ureter is the tube that transports the urine from the kidneys to the bladder. Your health care provider may place a stent before the procedure. This will help keep urine flowing from the kidney if the fragments of the stone block the ureter. You may have an IV tube placed in one of your veins to give you fluids and medicines. These medicines may help you relax or make you sleep. During the procedure, you will lie comfortably on a fluid-filled cushion or in a warm-water bath. After an X-ray or ultrasound exam to locate your stone, shock waves are aimed at the stone. If you are awake, you may feel a tapping sensation as the shock waves pass through your body. If large stone particles remain after treatment, a second procedure may be necessary at a later date. For comfort during the test:  Relax as much as possible.  Try to remain still as much as possible.  Try to follow instructions to speed up the test.  Let your health care provider know if you are uncomfortable, anxious, or in pain. AFTER THE PROCEDURE  After surgery, you will be taken to the recovery area. A nurse will watch and check your progress. Once you're awake, stable, and taking fluids well, you will be allowed to go home as long as there are no problems. You will also be allowed to pass your urine before discharge.You may be given antibiotics to help prevent infection. You may also be prescribed pain medicine if needed. In a week or two, your health care provider may remove your stent, if you have one. You may first have an X-ray exam to check on how successful the fragmentation of your  stone has been and how much of the stone has passed. Your health care provider will check to see whether or not stone particles remain. SEEK IMMEDIATE MEDICAL CARE IF:  You develop a fever or shaking chills.  Your pain is not relieved by medicine.  You feel sick to your stomach (nauseated) and you vomit.  You develop heavy bleeding.  You have difficulty urinating.  You start to pass your stent from your penis. Document Released: 11/26/2000 Document Revised: 09/19/2013 Document Reviewed: 06/14/2013 [facility] Patient Information 2015 [place]. This information is not intended to replace advice given to you by your health care provider. Make sure you discuss any questions you have with your health care provider.

## 2015-03-23 NOTE — ED Notes (Signed)
Awake. Verbally responsive. A/O x4. Resp even and unlabored. No audible adventitious breath sounds noted. ABC's intact.  

## 2015-03-23 NOTE — ED Provider Notes (Signed)
CSN: [id number]     Arrival date & time 03/22/15  2155 History   First MD Initiated Contact with Patient 03/22/15 2317     Chief Complaint  Patient presents with  . Flank Pain     (Consider location/radiation/quality/duration/timing/severity/associated sxs/prior Treatment) HPI  Patient states he woke todayup with painand he has left flank and left upper quadrant that has been constant but getting worse throughout the day. He states the pain is sharp and aching. Nothing he does makes it hurt more, nothing he does makes it feel better. He has had nausea without vomiting. He is unaware of fever. He has not had gross hematuria. He states he had similar pain about 45 years ago when he had a kidney stone.  PCP none  Past Medical History  Diagnosis Date  . Malaria   . Chronic joint pain    Past Surgical History  Procedure Laterality Date  . Hemorrhoid surgery    . Kidney stone surgery     No family history on file. History  Substance Use Topics  . Smoking status: Current Some Day Smoker  . Smokeless tobacco: Not on file  . Alcohol Use: Yes  buys "single" cigarettes prn Self-employed (owns a Science writer)  Review of Systems  All other systems reviewed and are negative.     Allergies  Review of patient's allergies indicates no known allergies.  Home Medications   Prior to Admission medications   Medication Sig Start Date End Date Taking? Authorizing Provider  loratadine (CLARITIN) 10 MG tablet Take 10 mg by mouth daily as needed for allergies.   Yes Historical Provider, MD  Alum & Mag Hydroxide-Simeth (MAGIC MOUTHWASH W/LIDOCAINE) SOLN Take 5 mLs by mouth 3 (three) times daily as needed for mouth pain. Patient not taking: Reported on 03/22/2015 04/24/14   Janne Napoleon, NP  amoxicillin-clavulanate (AUGMENTIN) 875-125 MG per tablet Take 1 tablet by mouth every 12 (twelve) hours. Patient not taking: Reported on 03/22/2015 02/16/14   Graylon Good, PA-C  cetirizine (ZYRTEC) 10  MG tablet Take 1 tablet (10 mg total) by mouth daily. Patient not taking: Reported on 03/22/2015 04/24/14   Janne Napoleon, NP  fluticasone [facility]) 50 MCG/ACT nasal spray Place 2 sprays into both nostrils 2 (two) times daily. Patient not taking: Reported on 03/22/2015 02/16/14   Graylon Good, PA-C  HYDROcodone-acetaminophen (NORCO/VICODIN) 5-325 MG per tablet Take 2 tablets by mouth every 4 (four) hours as needed for pain. Patient not taking: Reported on 03/22/2015 08/05/13   Elson Areas, PA-C  predniSONE (DELTASONE) 10 MG tablet 4 tabs PO QD for 4 days; 3 tabs PO QD for 3 days; 2 tabs PO QD for 2 days; 1 tab PO QD for 1 day Patient not taking: Reported on 03/22/2015 02/16/14   Graylon Good, PA-C   BP 141/94 mmHg  Pulse 98  Temp(Src) 98 F (36.7 C) (Oral)  Resp 18  SpO2 95%  Vital signs normal   Physical Exam  Constitutional: He is oriented to person, place, and time. He appears well-developed and well-nourished.  Non-toxic appearance. He does not appear ill. No distress.  HENT:  Head: Normocephalic and atraumatic.  Right Ear: External ear normal.  Left Ear: External ear normal.  Nose: Nose normal. No mucosal edema or rhinorrhea.  Mouth/Throat: Oropharynx is clear and moist and mucous membranes are normal. No dental abscesses or uvula swelling.  Eyes: Conjunctivae and EOM are normal. Pupils are equal, round, and reactive to light.  Neck: Normal range of motion and full passive range of motion without pain. Neck supple.  Cardiovascular: Normal rate, regular rhythm and normal heart sounds.  Exam reveals no gallop and no friction rub.   No murmur heard. Pulmonary/Chest: Effort normal and breath sounds normal. No respiratory distress. He has no wheezes. He has no rhonchi. He has no rales. He exhibits no tenderness and no crepitus.  Abdominal: Soft. Normal appearance and bowel sounds are normal. He exhibits no distension. There is tenderness. There is no rebound and no guarding.    Area of  pain  Musculoskeletal: Normal range of motion. He exhibits no edema or tenderness.  Moves all extremities well.   Neurological: He is alert and oriented to person, place, and time. He has normal strength. No cranial nerve deficit.  Skin: Skin is warm, dry and intact. No rash noted. No erythema. No pallor.  Psychiatric: He has a normal mood and affect. His speech is normal and behavior is normal. His mood appears not anxious.  Nursing note and vitals reviewed.   ED Course  Procedures (including critical care time)  Medications  HYDROmorphone (DILAUDID) injection 1 mg (not administered)  fentaNYL (SUBLIMAZE) injection 50 mcg (50 mcg Intravenous Given 03/22/15 2351)  ondansetron (ZOFRAN) injection 4 mg (4 mg Intravenous Given 03/22/15 2351)    Review of his chart shows in 2012 Dr. Isabel Caprice, urologist did ureter for removal of 2 stones. He also noted the patient had a bifurcation of the ureter on the left.  Patient's pain was improved at time of discharge she felt like he could be discharged home.  01:49 Dr Wilson Singer, wants him to be NPO after MN tonight. To call the office on Monday morning, April 11th for lithrotripsy. Discharge on cipro  Labs Review Results for orders placed or performed during the hospital encounter of 03/22/15  Comprehensive metabolic panel  Result Value Ref Range   Sodium 138 135 - 145 mmol/L   Potassium 3.2 (L) 3.5 - 5.1 mmol/L   Chloride 101 96 - 112 mmol/L   CO2 29 19 - 32 mmol/L   Glucose, Bld 92 70 - 99 mg/dL   BUN 14 6 - 23 mg/dL   Creatinine, Ser 1.91 0.50 - 1.35 mg/dL   Calcium 9.4 8.4 - 47.8 mg/dL   Total Protein 7.9 6.0 - 8.3 g/dL   Albumin 4.4 3.5 - 5.2 g/dL   AST 23 0 - 37 U/L   ALT 25 0 - 53 U/L   Alkaline Phosphatase 69 39 - 117 U/L   Total Bilirubin 1.0 0.3 - 1.2 mg/dL   GFR calc non Af Amer 71 (L) >90 mL/min   GFR calc Af Amer 82 (L) >90 mL/min   Anion gap 8 5 - 15  Lipase, blood  Result Value Ref Range   Lipase 32 11 - 59 U/L  CBC with  Differential  Result Value Ref Range   WBC 8.7 4.0 - 10.5 K/uL   RBC 5.28 4.22 - 5.81 MIL/uL   Hemoglobin 16.3 13.0 - 17.0 g/dL   HCT 29.5 62.1 - 30.8 %   MCV 88.6 78.0 - 100.0 fL   MCH 30.9 26.0 - 34.0 pg   MCHC 34.8 30.0 - 36.0 g/dL   RDW 65.7 84.6 - 96.2 %   Platelets 233 150 - 400 K/uL   Neutrophils Relative % 73 43 - 77 %   Neutro Abs 6.3 1.7 - 7.7 K/uL   Lymphocytes Relative 18 12 - 46 %  Lymphs Abs 1.6 0.7 - 4.0 K/uL   Monocytes Relative 6 3 - 12 %   Monocytes Absolute 0.5 0.1 - 1.0 K/uL   Eosinophils Relative 3 0 - 5 %   Eosinophils Absolute 0.3 0.0 - 0.7 K/uL   Basophils Relative 0 0 - 1 %   Basophils Absolute 0.0 0.0 - 0.1 K/uL  Urinalysis, Routine w reflex microscopic  Result Value Ref Range   Color, Urine YELLOW YELLOW   APPearance CLOUDY (A) CLEAR   Specific Gravity, Urine 1.015 1.005 - 1.030   pH 7.0 5.0 - 8.0   Glucose, UA NEGATIVE NEGATIVE mg/dL   Hgb urine dipstick MODERATE (A) NEGATIVE   Bilirubin Urine NEGATIVE NEGATIVE   Ketones, ur NEGATIVE NEGATIVE mg/dL   Protein, ur 30 (A) NEGATIVE mg/dL   Urobilinogen, UA 1.0 0.0 - 1.0 mg/dL   Nitrite NEGATIVE NEGATIVE   Leukocytes, UA SMALL (A) NEGATIVE  Urine microscopic-add on  Result Value Ref Range   Squamous Epithelial / LPF RARE RARE   WBC, UA 11-20 <3 WBC/hpf   RBC / HPF 7-10 <3 RBC/hpf   Bacteria, UA RARE RARE   Casts HYALINE CASTS (A) NEGATIVE   Laboratory interpretation all normal except for hematuriaand pyuria and hypokalemia    Imaging Review Ct Renal Stone Study  03/23/2015   CLINICAL DATA:  Initial evaluation for acute left flank pain with nausea. History of kidney stones.  EXAM: CT ABDOMEN AND PELVIS WITHOUT CONTRAST  TECHNIQUE: Multidetector CT imaging of the abdomen and pelvis was performed following the standard protocol without IV contrast.  COMPARISON:  Prior study from 04/11/2008  FINDINGS: Mild dependent subsegmental atelectasis present within the visualized lung bases. No pleural  pericardial effusion.  Limited noncontrast evaluation of the liver is unremarkable. Gallbladder within normal limits. No biliary dilatation. Spleen, adrenal glands, and pancreas are within normal limits.  Right kidney is unremarkable without evidence of nephrolithiasis or hydronephrosis. No radiopaque calculi seen along the course of the right renal collecting system.  On the left, there is a large obstructive 14 mm stone within the mid left ureter with secondary moderate left hydroureteronephrosis. Prominent periureteral fat stranding present about the ureter at the level of the obstructive stone. Additional nonobstructive calculi measuring up to 3 mm present within the left kidney. No other stone seen within the left ureter.  Stomach within normal limits. No evidence for bowel obstruction. No acute inflammatory changes seen about the bowels. Appendix is normal.  Bladder within normal limits.  Prostate unremarkable.  No free air or fluid.  No adenopathy.  No acute osseus abnormality. No worrisome lytic or blastic osseous lesions.  IMPRESSION: 1. 14 mm obstructive stone within the mid left ureter with secondary moderate left hydroureteronephrosis. 2. Additional nonobstructive left renal nephrolithiasis measuring up to 3 mm. 3. No other acute intra-abdominal or pelvic process.   Electronically Signed   By: Rise MuBenjamin  McClintock M.D.   On: 03/23/2015 00:44     EKG Interpretation None      MDM   Final diagnoses:  Acute left flank pain  History of kidney stones    New Prescriptions   CIPROFLOXACIN (CIPRO) 500 MG TABLET    Take 1 tablet (500 mg total) by mouth 2 (two) times daily.   ONDANSETRON (ZOFRAN) 4 MG TABLET    Take 1 tablet (4 mg total) by mouth every 8 (eight) hours as needed for nausea or vomiting.   OXYCODONE-ACETAMINOPHEN (PERCOCET/ROXICET) 5-325 MG PER TABLET    Take 1 or 2  po Q 6hrs for pain    Plan discharge  Devoria Albe, MD, Concha Pyo, MD 03/23/15 [phone]

## 2015-03-23 NOTE — ED Notes (Signed)
Awake. Verbally responsive. A/O x4. Resp even and unlabored. No audible adventitious breath sounds noted. ABC's intact. IV saline lock patent and intact. Family at bedside. 

## 2015-03-23 NOTE — ED Notes (Signed)
Awake. Verbally responsive. A/O x4. Resp even and unlabored. No audible adventitious breath sounds noted. ABC's intact. Family at bedside. 

## 2015-03-24 ENCOUNTER — Other Ambulatory Visit: Payer: Self-pay | Admitting: Urology

## 2015-03-24 LAB — URINE CULTURE
Colony Count: NO GROWTH
Culture: NO GROWTH
Special Requests: NORMAL

## 2015-03-25 ENCOUNTER — Encounter (HOSPITAL_COMMUNITY): Payer: Self-pay | Admitting: General Practice

## 2015-03-26 NOTE — H&P (Signed)
History of Present Illness Consultation for left proximal ureteral stone. Patient has a history of stones and developed left flank pain a few days ago. He underwent CT scan of the abdomen and pelvis which revealed a 14 mm left proximal ureteral stone. There was proximal hydroureteronephrosis and no other significant stone burden. SSD 10-11 cm, Hounsfield units 1500, stone visible on the scout. His BUN was 14, creatinine 1.2. UA showed rare bacteria 11-20 red cells, 7-10 white cells, urine culture was no growth. He underwent bilateral ureteroscopy for stones in 2009. He was started on Zofran and Percocet. Also Cipro. Today he's been staying hydrated and had no further emesis. Vitals are stable.     Past Medical History Problems  1. History of Rectal prolapse (K62.3)  Surgical History Problems  1. History of Cystoscopy With Insertion Of Ureteral Stent Bilateral 2. History of Cystoscopy With Ureteroscopy With Lithotripsy 3. History of Cystoscopy With Ureteroscopy With Removal Of Calculus 4. History of Rectal Surgery  Current Meds 1. Amoxicillin TABS;  Therapy: (Recorded:11Apr2016) to Recorded 2. Hydrocodone-Acetaminophen 5-325 MG Oral Tablet;  Therapy: (Recorded:11Apr2016) to Recorded  Allergies Medication  1. No Known Drug Allergies  Family History Problems  1. Family history of Family Health Status - Father's Age 65. Family history of Family Health Status - Mother's Age 72. Family history of Family Health Status Number Of Children 4. Family history of tuberculosis (Z83.1) : Significant Other  Social History Problems    Light tobacco smoker (F17.200)   Marital History - Currently Married   Never A Smoker   Occasional caffeine consumption   Social alcohol use (Z78.9)   Two children  Review of Systems Constitutional, skin, eye, otolaryngeal, hematologic/lymphatic, cardiovascular, pulmonary, endocrine, musculoskeletal, gastrointestinal, neurological and psychiatric  system(s) were reviewed and pertinent findings if present are noted and are otherwise negative.  Gastrointestinal: constipation.  Constitutional: feeling tired (fatigue).    Vitals Vital Signs [Data Includes: Last 1 Day]  Recorded: 11Apr2016 10:17AM  Height: 5 ft 4 in Weight: 140 lb  BMI Calculated: 24.03 BSA Calculated: 1.68 Blood Pressure: 134 / 91 Temperature: 97.7 F Heart Rate: 74  Physical Exam Constitutional: Well nourished and well developed . No acute distress.  Pulmonary: No respiratory distress and normal respiratory rhythm and effort.  Cardiovascular: Heart rate and rhythm are normal . No peripheral edema.  Neuro/Psych:. Mood and affect are appropriate.    Results/Data Urine [Data Includes: Last 1 Day]   11Apr2016  COLOR YELLOW   APPEARANCE CLEAR   SPECIFIC GRAVITY 1.025   pH 5.5   GLUCOSE NEG mg/dL  BILIRUBIN NEG   KETONE NEG mg/dL  BLOOD TRACE   PROTEIN NEG mg/dL  UROBILINOGEN 0.2 mg/dL  NITRITE NEG   LEUKOCYTE ESTERASE NEG   SQUAMOUS EPITHELIAL/HPF NONE SEEN   WBC NONE SEEN WBC/hpf  RBC 0-2 RBC/hpf  BACTERIA NONE SEEN   CRYSTALS NONE SEEN   CASTS NONE SEEN    Old records or history reviewed:Marland Kitchen.  The following images/tracing/specimen were independently visualized: Marland Kitchen.    Assessment Assessed  1. Calculus of ureter (N20.1)  Plan Calculus of ureter  1. Follow-up Schedule Surgery Office  Follow-up  Status: Hold For - Appointment   Requested for: 11Apr2016 Health Maintenance  2. UA With REFLEX; [Do Not Release]; Status:Resulted - Requires Verification;   Done:  11Apr2016 10:01AM  Discussion/Summary Left ureteral stone - the understanding kidney stone booklet with a long discussion about the nature risk and benefits of continued symptoms such, shockwave lithotripsy and ureteroscopy. We discussed  the risk of each approach and the relative success rates. Likelihood of staged procedure with each approach which is possible with either. All questions answered.  He elected to proceed with left shockwave lithotripsy.    Left renal stone- discussed small stone in the left kidney which we will monitor. He also may need a metabolic evaluation was stone free.     Signatures Electronically signed by : Jerilee Field, M.D.; Mar 24 2015 11:20AM EST

## 2015-03-27 ENCOUNTER — Encounter (HOSPITAL_COMMUNITY): Payer: Self-pay | Admitting: *Deleted

## 2015-03-27 ENCOUNTER — Ambulatory Visit (HOSPITAL_COMMUNITY)
Admission: RE | Admit: 2015-03-27 | Discharge: 2015-03-27 | Disposition: A | Discharge status: Home / Self Care | Payer: Self-pay | Source: Ambulatory Visit | Attending: Urology | Admitting: Urology

## 2015-03-27 ENCOUNTER — Encounter (HOSPITAL_COMMUNITY)
Admission: RE | Disposition: A | Discharge status: Home / Self Care | Payer: Self-pay | Source: Ambulatory Visit | Attending: Urology

## 2015-03-27 ENCOUNTER — Ambulatory Visit (HOSPITAL_COMMUNITY): Payer: Self-pay

## 2015-03-27 DIAGNOSIS — K623 Rectal prolapse: Secondary | ICD-10-CM | POA: Insufficient documentation

## 2015-03-27 DIAGNOSIS — N201 Calculus of ureter: Secondary | ICD-10-CM | POA: Insufficient documentation

## 2015-03-27 DIAGNOSIS — F17200 Nicotine dependence, unspecified, uncomplicated: Secondary | ICD-10-CM | POA: Insufficient documentation

## 2015-03-27 HISTORY — PX: EXTRACORPOREAL SHOCK WAVE LITHOTRIPSY: SHX1557

## 2015-03-27 SURGERY — LITHOTRIPSY, ESWL
Anesthesia: LOCAL | Laterality: Left

## 2015-03-27 MED ORDER — DIPHENHYDRAMINE HCL 25 MG PO CAPS
25.0000 mg | ORAL_CAPSULE | ORAL | Status: AC
  Administered 2015-03-27: 25 mg via ORAL
  Filled 2015-03-27: qty 1

## 2015-03-27 MED ORDER — CIPROFLOXACIN HCL 500 MG PO TABS
500.0000 mg | ORAL_TABLET | ORAL | Status: AC
  Administered 2015-03-27: 500 mg via ORAL
  Filled 2015-03-27: qty 1

## 2015-03-27 MED ORDER — SODIUM CHLORIDE 0.9 % IV SOLN
INTRAVENOUS | Status: DC
  Administered 2015-03-27: 10:00:00 via INTRAVENOUS

## 2015-03-27 MED ORDER — DIAZEPAM 5 MG PO TABS
10.0000 mg | ORAL_TABLET | ORAL | Status: AC
Start: 2015-03-27 — End: 2015-03-27
  Administered 2015-03-27: 10 mg via ORAL
  Filled 2015-03-27: qty 2

## 2015-03-27 MED ORDER — TAMSULOSIN HCL 0.4 MG PO CAPS: 0.4000 mg | ORAL_CAPSULE | Freq: Every day | ORAL | Status: DC

## 2015-03-27 NOTE — Op Note (Signed)
Diagnosis: Left proximal ureteral stone   Procedure: Left proximal ESWL   See [facility] Op Note

## 2015-03-27 NOTE — Interval H&P Note (Signed)
History and Physical Interval Note:  03/27/2015 11:43 AM  Alan Cervantes  has presented today for surgery, with the diagnosis of LEFT URETERAL STONE  The various methods of treatment have been discussed with the patient and family. After consideration of risks, benefits and other options for treatment, the patient has consented to  Procedure(s): LEFT EXTRACORPOREAL SHOCK WAVE LITHOTRIPSY (ESWL) (Left) as a surgical intervention .  The patient's history has been reviewed, patient examined, no change in status, stable for surgery.  I have reviewed the patient's chart and labs.  Questions were answered to the patient's satisfaction.  KUB stable. Pt said he did not like that "going up inside" from last time and hopes ESWL will work.    Alayshia Marini

## 2015-03-27 NOTE — Discharge Instructions (Signed)

## 2015-05-01 ENCOUNTER — Other Ambulatory Visit: Payer: Self-pay | Admitting: Urology

## 2015-05-06 ENCOUNTER — Encounter (HOSPITAL_BASED_OUTPATIENT_CLINIC_OR_DEPARTMENT_OTHER): Payer: Self-pay | Admitting: *Deleted

## 2015-05-06 NOTE — Progress Notes (Signed)
NPO AFTER MN.  ARRIVE AT 0830.  NEEDS HG. 

## 2015-05-09 NOTE — H&P (Signed)
Active Problems Problems  1. Calculus of ureter (N20.1)   Assessed By: Jetta LoutWarden, Diane (Urology); Last Assessed: 29 Apr 2015  History of Present Illness    44 YO male patient of Dr. Estil DaftEskridge's seen 3 weeks ago for a post ESWL visit. S/P (L) Proximal ureteral ESWL 03/27/15.    Interval Hx April 2016:  Today states he did very well post procedure. Has passed a few stone fragments. Had some mild left flank tenderness post procedure but this has improved. Denies hematuria, f/c, or n/v.     May 2016 Interval Hx:   Today states he continues to have mild left flank pain. Has not passed any further stone material. Denies hematuria, n/v, or f/c.   Vitals Vital Signs [Data Includes: Last 1 Day]  Recorded: 17May2016 11:15AM  Blood Pressure: 132 / 93 Temperature: 98.3 F Heart Rate: 72  Physical Exam Constitutional: Well nourished and well developed . No acute distress. The patient appears well hydrated.  Abdomen: The abdomen is flat. The abdomen is soft and nontender. No suprapubic tenderness. No CVA tenderness.    Results/Data Urine [Data Includes: Last 1 Day]   17May2016  COLOR YELLOW   APPEARANCE CLEAR   SPECIFIC GRAVITY 1.025   pH 6.0   GLUCOSE NEG mg/dL  BILIRUBIN NEG   KETONE NEG mg/dL  BLOOD NEG   PROTEIN 130100 mg/dL  UROBILINOGEN 1 mg/dL  NITRITE NEG   LEUKOCYTE ESTERASE TRACE   SQUAMOUS EPITHELIAL/HPF RARE   WBC 7-10 WBC/hpf  RBC 0-2 RBC/hpf  BACTERIA RARE   CRYSTALS NONE SEEN   CASTS NONE SEEN   Other MUCUS    The following images/tracing/specimen were independently visualized:  KUB: shows no change or progression of left ureteral calculus.    Assessment Assessed  1. Calculus of ureter (N20.1)  Plan Discussed with pt he would prefer at this time to proceed with cystourethroscopy, L RPG, stone extraction vs repeat ESWL. Will have Dr. Mena GoesEskridge call pt to discuss treatment plan.   Signatures Electronically signed by : Jetta Loutiane Warden, Dyann RuddleANP-C; Apr 29 2015 11:50AM  EST

## 2015-05-13 ENCOUNTER — Ambulatory Visit (HOSPITAL_BASED_OUTPATIENT_CLINIC_OR_DEPARTMENT_OTHER)
Admission: RE | Admit: 2015-05-13 | Discharge: 2015-05-13 | Disposition: A | Discharge status: Home / Self Care | Payer: Self-pay | Source: Ambulatory Visit | Attending: Urology | Admitting: Urology

## 2015-05-13 ENCOUNTER — Ambulatory Visit (HOSPITAL_BASED_OUTPATIENT_CLINIC_OR_DEPARTMENT_OTHER): Payer: Self-pay | Admitting: Anesthesiology

## 2015-05-13 ENCOUNTER — Encounter (HOSPITAL_BASED_OUTPATIENT_CLINIC_OR_DEPARTMENT_OTHER): Payer: Self-pay | Admitting: *Deleted

## 2015-05-13 ENCOUNTER — Encounter (HOSPITAL_BASED_OUTPATIENT_CLINIC_OR_DEPARTMENT_OTHER)
Admission: RE | Disposition: A | Discharge status: Home / Self Care | Payer: Self-pay | Source: Ambulatory Visit | Attending: Urology

## 2015-05-13 DIAGNOSIS — F17200 Nicotine dependence, unspecified, uncomplicated: Secondary | ICD-10-CM | POA: Insufficient documentation

## 2015-05-13 DIAGNOSIS — Z9889 Other specified postprocedural states: Secondary | ICD-10-CM | POA: Insufficient documentation

## 2015-05-13 DIAGNOSIS — N201 Calculus of ureter: Secondary | ICD-10-CM | POA: Insufficient documentation

## 2015-05-13 DIAGNOSIS — Q628 Other congenital malformations of ureter: Secondary | ICD-10-CM | POA: Insufficient documentation

## 2015-05-13 HISTORY — DX: Personal history of urinary calculi: Z87.442

## 2015-05-13 HISTORY — DX: Calculus of ureter: N20.1

## 2015-05-13 HISTORY — DX: Unspecified hemorrhoids: K64.9

## 2015-05-13 HISTORY — PX: HOLMIUM LASER APPLICATION: SHX5852

## 2015-05-13 HISTORY — PX: CYSTOSCOPY WITH URETEROSCOPY AND STENT PLACEMENT: SHX6377

## 2015-05-13 LAB — POCT HEMOGLOBIN-HEMACUE: Hemoglobin: 16 g/dL (ref 13.0–17.0)

## 2015-05-13 SURGERY — CYSTOURETEROSCOPY, WITH STENT INSERTION
Anesthesia: General | Site: Ureter | Laterality: Left

## 2015-05-13 MED ORDER — OXYCODONE-ACETAMINOPHEN 5-325 MG PO TABS: 1.0000 | ORAL_TABLET | Freq: Four times a day (QID) | ORAL | Status: DC | PRN

## 2015-05-13 MED ORDER — MIDAZOLAM HCL 2 MG/2ML IJ SOLN
INTRAMUSCULAR | Status: AC
  Filled 2015-05-13: qty 2

## 2015-05-13 MED ORDER — IOHEXOL 350 MG/ML SOLN
INTRAVENOUS | Status: DC | PRN
  Administered 2015-05-13: 10 mL

## 2015-05-13 MED ORDER — OXYCODONE-ACETAMINOPHEN 5-325 MG PO TABS
ORAL_TABLET | ORAL | Status: AC
  Filled 2015-05-13: qty 1

## 2015-05-13 MED ORDER — CEFAZOLIN SODIUM 1-5 GM-% IV SOLN
1.0000 g | INTRAVENOUS | Status: DC
  Filled 2015-05-13: qty 50

## 2015-05-13 MED ORDER — FENTANYL CITRATE (PF) 100 MCG/2ML IJ SOLN
INTRAMUSCULAR | Status: DC | PRN
  Administered 2015-05-13 (×2): 50 ug via INTRAVENOUS

## 2015-05-13 MED ORDER — LACTATED RINGERS IV SOLN
INTRAVENOUS | Status: DC
  Administered 2015-05-13 (×2): via INTRAVENOUS
  Filled 2015-05-13: qty 1000

## 2015-05-13 MED ORDER — FENTANYL CITRATE (PF) 100 MCG/2ML IJ SOLN
INTRAMUSCULAR | Status: AC
  Filled 2015-05-13: qty 4

## 2015-05-13 MED ORDER — OXYCODONE-ACETAMINOPHEN 5-325 MG PO TABS
1.0000 | ORAL_TABLET | Freq: Four times a day (QID) | ORAL | Status: DC | PRN
  Administered 2015-05-13: 1 via ORAL
  Filled 2015-05-13: qty 2

## 2015-05-13 MED ORDER — PROPOFOL 10 MG/ML IV BOLUS
INTRAVENOUS | Status: DC | PRN
  Administered 2015-05-13: 200 mg via INTRAVENOUS

## 2015-05-13 MED ORDER — ONDANSETRON HCL 4 MG/2ML IJ SOLN
INTRAMUSCULAR | Status: DC | PRN
  Administered 2015-05-13: 4 mg via INTRAVENOUS

## 2015-05-13 MED ORDER — KETOROLAC TROMETHAMINE 30 MG/ML IJ SOLN
INTRAMUSCULAR | Status: DC | PRN
  Administered 2015-05-13: 30 mg via INTRAVENOUS

## 2015-05-13 MED ORDER — SODIUM CHLORIDE 0.9 % IR SOLN
Status: DC | PRN
  Administered 2015-05-13: 3000 mL
  Administered 2015-05-13: 1000 mL

## 2015-05-13 MED ORDER — LIDOCAINE HCL (CARDIAC) 20 MG/ML IV SOLN
INTRAVENOUS | Status: DC | PRN
  Administered 2015-05-13: 80 mg via INTRAVENOUS

## 2015-05-13 MED ORDER — EPHEDRINE SULFATE 50 MG/ML IJ SOLN
INTRAMUSCULAR | Status: DC | PRN
  Administered 2015-05-13: 10 mg via INTRAVENOUS
  Administered 2015-05-13: 15 mg via INTRAVENOUS

## 2015-05-13 MED ORDER — LACTATED RINGERS IV SOLN
INTRAVENOUS | Status: DC
  Filled 2015-05-13: qty 1000

## 2015-05-13 MED ORDER — DEXAMETHASONE SODIUM PHOSPHATE 4 MG/ML IJ SOLN
INTRAMUSCULAR | Status: DC | PRN
  Administered 2015-05-13: 10 mg via INTRAVENOUS

## 2015-05-13 MED ORDER — TAMSULOSIN HCL 0.4 MG PO CAPS: 0.4000 mg | ORAL_CAPSULE | Freq: Every day | ORAL | Status: DC

## 2015-05-13 MED ORDER — CEFAZOLIN SODIUM-DEXTROSE 2-3 GM-% IV SOLR
INTRAVENOUS | Status: AC
  Filled 2015-05-13: qty 50

## 2015-05-13 MED ORDER — CEFAZOLIN SODIUM-DEXTROSE 2-3 GM-% IV SOLR
2.0000 g | INTRAVENOUS | Status: AC
  Administered 2015-05-13: 2 g via INTRAVENOUS
  Filled 2015-05-13: qty 50

## 2015-05-13 MED ORDER — FENTANYL CITRATE (PF) 100 MCG/2ML IJ SOLN
25.0000 ug | INTRAMUSCULAR | Status: DC | PRN
  Filled 2015-05-13: qty 1

## 2015-05-13 MED ORDER — MIDAZOLAM HCL 5 MG/5ML IJ SOLN
INTRAMUSCULAR | Status: DC | PRN
  Administered 2015-05-13: 2 mg via INTRAVENOUS

## 2015-05-13 SURGICAL SUPPLY — 34 items
ADAPTER CATH URET PLST 4-6FR (CATHETERS) IMPLANT
BAG DRAIN URO-CYSTO SKYTR STRL (DRAIN) ×3 IMPLANT
BASKET LASER NITINOL 1.9FR (BASKET) IMPLANT
BASKET STNLS GEMINI 4WIRE 3FR (BASKET) IMPLANT
BASKET ZERO TIP NITINOL 2.4FR (BASKET) ×3 IMPLANT
CANISTER SUCT LVC 12 LTR MEDI- (MISCELLANEOUS) IMPLANT
CATH INTERMIT  6FR 70CM (CATHETERS) IMPLANT
CATH URET 5FR 28IN CONE TIP (BALLOONS)
CATH URET 5FR 28IN OPEN ENDED (CATHETERS) IMPLANT
CATH URET 5FR 70CM CONE TIP (BALLOONS) IMPLANT
CATH URET DUAL LUMEN 6-10FR 50 (CATHETERS) IMPLANT
CLOTH BEACON ORANGE TIMEOUT ST (SAFETY) ×3 IMPLANT
ELECT REM PT RETURN 9FT ADLT (ELECTROSURGICAL)
ELECTRODE REM PT RTRN 9FT ADLT (ELECTROSURGICAL) IMPLANT
FIBER LASER TRAC TIP (UROLOGICAL SUPPLIES) ×3 IMPLANT
GLOVE BIO SURGEON STRL SZ7.5 (GLOVE) ×3 IMPLANT
GOWN STRL REUS W/ TWL LRG LVL3 (GOWN DISPOSABLE) ×1 IMPLANT
GOWN STRL REUS W/ TWL XL LVL3 (GOWN DISPOSABLE) ×1 IMPLANT
GOWN STRL REUS W/TWL LRG LVL3 (GOWN DISPOSABLE) ×2
GOWN STRL REUS W/TWL XL LVL3 (GOWN DISPOSABLE) ×8 IMPLANT
GUIDEWIRE 0.038 PTFE COATED (WIRE) IMPLANT
GUIDEWIRE ANG ZIPWIRE 038X150 (WIRE) IMPLANT
GUIDEWIRE STR DUAL SENSOR (WIRE) ×3 IMPLANT
IV NS 1000ML (IV SOLUTION) ×2
IV NS 1000ML BAXH (IV SOLUTION) ×1 IMPLANT
IV NS IRRIG 3000ML ARTHROMATIC (IV SOLUTION) ×3 IMPLANT
KIT BALLIN UROMAX 15FX10 (LABEL) IMPLANT
KIT BALLN UROMAX 15FX4 (MISCELLANEOUS) IMPLANT
KIT BALLN UROMAX 26 75X4 (MISCELLANEOUS)
PACK CYSTO (CUSTOM PROCEDURE TRAY) ×3 IMPLANT
SET HIGH PRES BAL DIL (LABEL)
SHEATH ACCESS URETERAL 38CM (SHEATH) IMPLANT
STENT URET 6FRX26 CONTOUR (STENTS) ×3 IMPLANT
SYRINGE IRR TOOMEY STRL 70CC (SYRINGE) IMPLANT

## 2015-05-13 NOTE — Discharge Instructions (Signed)
Ureteral Stent Implantation, Care After Refer to this sheet in the next few weeks. These instructions provide you with information on caring for yourself after your procedure. Your health care provider may also give you more specific instructions. Your treatment has been planned according to current medical practices, but problems sometimes occur. Call your health care provider if you have any problems or questions after your procedure. WHAT TO EXPECT AFTER THE PROCEDURE You should be back to normal activity within 48 hours after the procedure. Nausea and vomiting may occur and are commonly the result of anesthesia. It is common to experience sharp pain in the back or lower abdomen and penis with voiding. This is caused by movement of the ends of the stent with the act of urinating.It usually goes away within minutes after you have stopped urinating. HOME CARE INSTRUCTIONS Make sure to drink plenty of fluids. You may have small amounts of bleeding, causing your urine to be red. This is normal. Certain movements may trigger pain or a feeling that you need to urinate.   REMOVAL OF THE STENT: remove the stent Monday morning, May 19, 2015 by pulling the string with slow, steady pressure.   Be sure to keep all follow-up appointments so your health care provider can check that you are healing properly.  SEEK MEDICAL CARE IF:  You experience increasing pain.  Your pain medicine is not working. SEEK IMMEDIATE MEDICAL CARE IF:  Your urine is dark red or has blood clots.  You are leaking urine (incontinent).  You have a fever, chills, feeling sick to your stomach (nausea), or vomiting.  Your pain is not relieved by pain medicine.  The end of the stent comes out of the urethra.  You are unable to urinate.   Post Anesthesia Home Care Instructions  Activity: Get plenty of rest for the remainder of the day. A responsible adult should stay with you for 24 hours following the procedure.  For the  next 24 hours, DO NOT: -Drive a car -Advertising copywriterperate machinery -Drink alcoholic beverages -Take any medication unless instructed by your physician -Make any legal decisions or sign important papers.  Meals: Start with liquid foods such as gelatin or soup. Progress to regular foods as tolerated. Avoid greasy, spicy, heavy foods. If nausea and/or vomiting occur, drink only clear liquids until the nausea and/or vomiting subsides. Call your physician if vomiting continues.  Special Instructions/Symptoms: Your throat may feel dry or sore from the anesthesia or the breathing tube placed in your throat during surgery. If this causes discomfort, gargle with warm salt water. The discomfort should disappear within 24 hours.  If you had a scopolamine patch placed behind your ear for the management of post- operative nausea and/or vomiting:  1. The medication in the patch is effective for 72 hours, after which it should be removed.  Wrap patch in a tissue and discard in the trash. Wash hands thoroughly with soap and water. 2. You may remove the patch earlier than 72 hours if you experience unpleasant side effects which may include dry mouth, dizziness or visual disturbances. 3. Avoid touching the patch. Wash your hands with soap and water after contact with the patch.

## 2015-05-13 NOTE — Interval H&P Note (Signed)
History and Physical Interval Note:  05/13/2015 9:24 AM  Alan Cervantes  has presented today for surgery, with the diagnosis of LEFT URETER STONE   The various methods of treatment have been discussed with the patient and family. After consideration of risks, benefits and other options for treatment, the patient has consented to  Procedure(s): CYSTOSCOPY WITH LEFT URETEROSCOPY AND STENT PLACEMENT (Left) HOLMIUM LASER APPLICATION (Left) as a surgical intervention .  The patient's history has been reviewed, patient examined, no change in status, stable for surgery.  I have reviewed the patient's chart, images and labs. He has been well without fever, bladder pain, frequency, urgency or gross hematuria. He's passed no other fragments. He had some mild dysuria last night. I discussed with patient and his wife risk of ureteral injury among others and possible need for staged procedure/pre-stent. Discussed alternatives such as no treatment. Questions were answered to the patient's satisfaction. He elects to proceed.    Cornelia Walraven

## 2015-05-13 NOTE — Transfer of Care (Signed)
Immediate Anesthesia Transfer of Care Note  Patient: Alan Cervantes  Procedure(s) Performed: Procedure(s): CYSTOSCOPY WITH LEFT URETEROSCOPY AND STENT PLACEMENT (Left) HOLMIUM LASER APPLICATION (Left)  Patient Location: PACU  Anesthesia Type:General  Level of Consciousness: awake, alert , oriented and patient cooperative  Airway & Oxygen Therapy: Patient Spontanous Breathing and Patient connected to nasal cannula oxygen  Post-op Assessment: Report given to RN and Post -op Vital signs reviewed and stable  Post vital signs: Reviewed and stable  Last Vitals:  Filed Vitals:   05/13/15 0850  BP: 147/95  Pulse: 78  Temp: 36.9 C  Resp: 16    Complications: No apparent anesthesia complications

## 2015-05-13 NOTE — Op Note (Signed)
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  Indication for procedure: Patient is a 18 rolled male with a persistent left ureteral stone following shockwave lithotripsy. He was brought today for definitive ureteroscopy.  Findings: On cystoscopy the urethra appeared normal. The prostatic urethra was normal. The trigone and ureteral orifices were normal. The bladder mucosa appeared normal. There were no stones or foreign bodies in the bladder.  Left retrograde pyelogram-this outlined a bifid system with a single ureter and the distal ureter, bifurcation in the mid ureter, the lower pole ureter appeared normal without filling defect stricture dilation. Though a middle pole and lower pole collecting system appeared normal. The upper pole ureter contained a filling defect and obstruction consistent with the known stone. Of note the stone was visible on scout imaging in the proximal ureter.   On ureteroscopy the distal ureter appeared normal. The mid ureter did bifurcate the upper pole moiety did have a narrowing at the bifurcation and was not obstructed but was obviously narrow compared to the lower pole ureter. Photos were taken. There was a smaller fragments in the upper moiety proximal ureter and the larger fragment above this one. The wire was confirmed to be in the lumen all the way up to the upper moiety UPJ. Following laser lithotripsy thotripsy and stone basket extraction the ureters were carefully inspected and noted to be free of significant stone fragments and free of injury.  Description of procedure: After consent was obtained patient brought to the operating room. After adequate anesthesia he is placed in lithotomy position and  prepped and draped in the usual sterile fashion. A timeout was performed to confirm the patient and procedure. Cystoscope was passed per urethra and the left ureteral orifice cannulated with a 6 [place] open-ended catheter and retrograde injection of contrast performed. A sensor wire was guided into the upper moiety ureter past the stone. The single-channel semirigid ureteroscope was advanced and guided into the upper moiety ureter where a stone fragment was noted. It was brought all the way to the UVJ where it became lodged. Therefore I left in place there passed a 200  laser fiber and dusted the stone at a setting of 0.3 and 50.   The ureteroscope was readvanced into the upper moiety ureter where larger fragment was noted more proximally. This again was fragmented 0.3 and 50 and a few small pieces were extracted and dropped in the bladder. There were no further fragments that could be picked up with the basket. I inspected all the way up to the collecting system of the upper moiety and the ureter appeared normal without significant fragment. On the way out there was no ureteral injury or significant fragment was able to perform ureteroscopy in the lower moiety ureter all the way up or the collecting system and this was noted to be free of any fragments or injury. The scope was removed. The cystoscope was placed back in the bladder with the bladder was drained and all the fragments washout. The wire was backloaded on the cystoscope and a 6 x 26 and ureter stent was advanced which coiled in the upper pole moiety and in the bladder. The bladder was drained and the scope removed. I left a string on the stent.   Complications: None  Blood loss: Minimal   Drains: 6  x 26 cm left ureteral stent with string   Specimens: Stone fragments to office lab   Disposition: Patient stable to PACU

## 2015-05-13 NOTE — Anesthesia Procedure Notes (Signed)
Procedure Name: LMA Insertion Date/Time: 05/13/2015 12:33 PM Performed by: Tyrone NineSAUVE, Marybella Ethier F Pre-anesthesia Checklist: Patient identified, Timeout performed, Emergency Drugs available, Suction available and Patient being monitored Patient Re-evaluated:Patient Re-evaluated prior to inductionOxygen Delivery Method: Circle system utilized Preoxygenation: Pre-oxygenation with 100% oxygen Intubation Type: IV induction Ventilation: Mask ventilation without difficulty LMA: LMA inserted LMA Size: 4.0 Number of attempts: 1

## 2015-05-13 NOTE — Anesthesia Preprocedure Evaluation (Addendum)
Anesthesia Evaluation  Patient identified by MRN, date of birth, ID band Patient awake    Reviewed: Allergy & Precautions, H&P , NPO status , Patient's Chart, lab work & pertinent test results  Airway Mallampati: II  TM Distance: >3 FB Neck ROM: full    Dental no notable dental hx. (+) Teeth Intact, Dental Advisory Given   Pulmonary neg pulmonary ROS, Current Smoker,  breath sounds clear to auscultation  Pulmonary exam normal       Cardiovascular Exercise Tolerance: Good negative cardio ROS Normal cardiovascular examRhythm:regular Rate:Normal     Neuro/Psych negative neurological ROS  negative psych ROS   GI/Hepatic negative GI ROS, Neg liver ROS,   Endo/Other  negative endocrine ROS  Renal/GU negative Renal ROS  negative genitourinary   Musculoskeletal   Abdominal   Peds  Hematology negative hematology ROS (+)   Anesthesia Other Findings   Reproductive/Obstetrics negative OB ROS                             Anesthesia Physical Anesthesia Plan  ASA: II  Anesthesia Plan: General   Post-op Pain Management:    Induction: Intravenous  Airway Management Planned: LMA  Additional Equipment:   Intra-op Plan:   Post-operative Plan:   Informed Consent: I have reviewed the patients History and Physical, chart, labs and discussed the procedure including the risks, benefits and alternatives for the proposed anesthesia with the patient or authorized representative who has indicated his/her understanding and acceptance.   Dental Advisory Given  Plan Discussed with: CRNA and Surgeon  Anesthesia Plan Comments:         Anesthesia Quick Evaluation  

## 2015-05-13 NOTE — Anesthesia Postprocedure Evaluation (Signed)
  Anesthesia Post-op Note  Patient: Alan Cervantes  Procedure(s) Performed: Procedure(s) (LRB): CYSTOSCOPY WITH LEFT URETEROSCOPY AND STENT PLACEMENT (Left) HOLMIUM LASER APPLICATION (Left)  Patient Location: PACU  Anesthesia Type: General  Level of Consciousness: awake and alert   Airway and Oxygen Therapy: Patient Spontanous Breathing  Post-op Pain: mild  Post-op Assessment: Post-op Vital signs reviewed, Patient's Cardiovascular Status Stable, Respiratory Function Stable, Patent Airway and No signs of Nausea or vomiting  Last Vitals:  Filed Vitals:   05/13/15 1353  BP: 137/91  Pulse: 95  Temp: 36.3 C  Resp: 14    Post-op Vital Signs: stable   Complications: No apparent anesthesia complications

## 2015-05-14 ENCOUNTER — Encounter (HOSPITAL_BASED_OUTPATIENT_CLINIC_OR_DEPARTMENT_OTHER): Payer: Self-pay | Admitting: Urology

## 2015-05-27 ENCOUNTER — Encounter (HOSPITAL_BASED_OUTPATIENT_CLINIC_OR_DEPARTMENT_OTHER): Payer: Self-pay | Admitting: Urology

## 2015-05-27 NOTE — Addendum Note (Signed)
Addendum  created 05/27/15 1557 by Karie Schwalbe, MD   Modules edited: Anesthesia Events

## 2015-05-27 NOTE — Addendum Note (Signed)
Addendum  created 05/27/15 1556 by Karie Schwalbe, MD   Modules edited: Anesthesia Events

## 2016-12-04 IMAGING — CR DG ABDOMEN 1V
1 series · 1 of 1 positions shown · non-contrast
Comparison: CT, 03/22/2015

CLINICAL DATA: Preop for left-sided kidney stone. Some flank pain
on the left this morning.

EXAM:
ABDOMEN - 1 VIEW

[t abdomen supine]
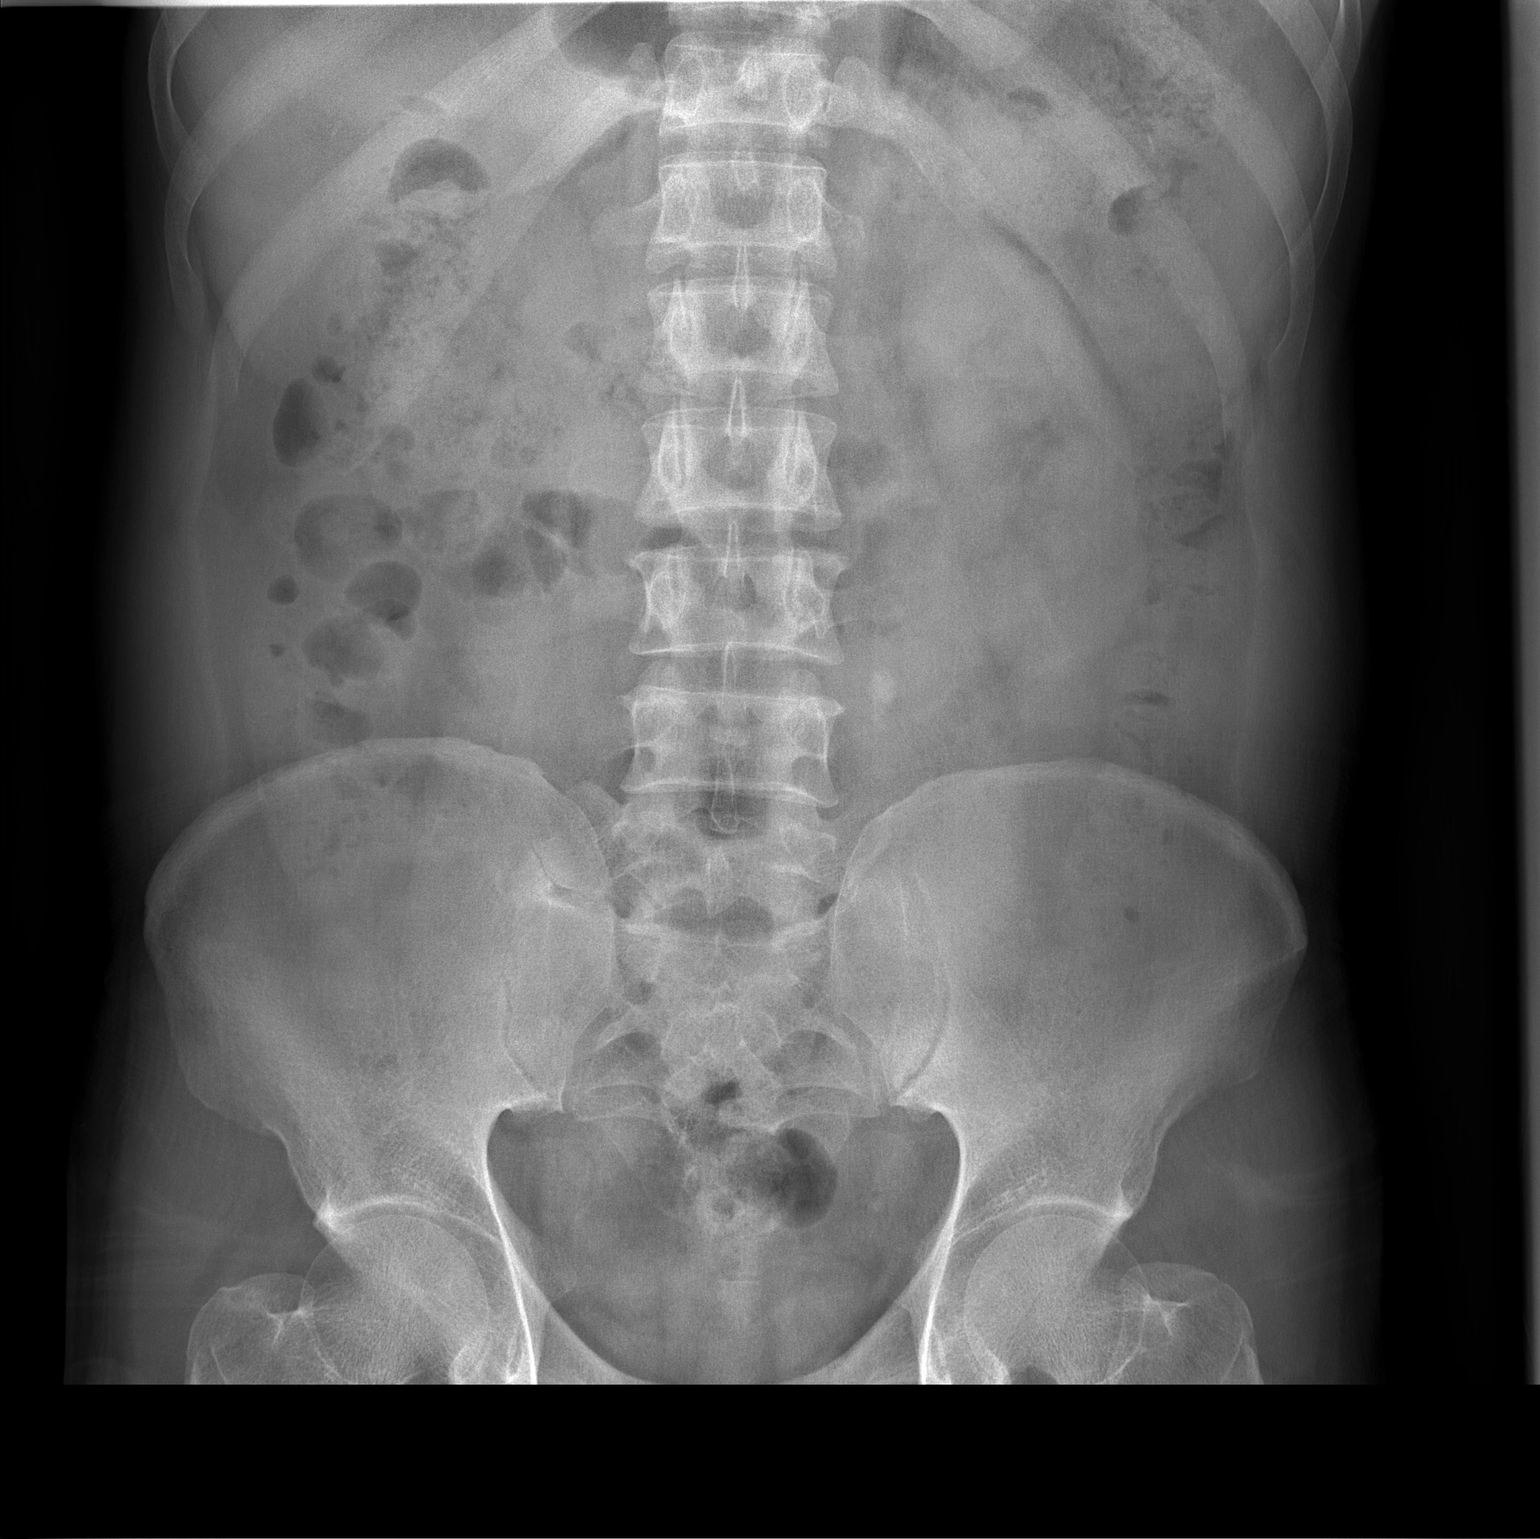

[1 of 1 positions shown; findings below may reference images not displayed]

FINDINGS: 9 mm calculus lies to the left of the L4-L5 disc reflecting the
intrarenal stone noted on the recent prior CT. Location is similar.

No convincing intrarenal stones. Small left intrarenal stones noted
on CT are not resolved radiographically. No other ureteral stones.

Normal bowel gas pattern.  Bony structures are unremarkable.
IMPRESSION: 9 mm stone in the mid left ureter, without change in position when
compared to the prior CT.

## 2019-10-02 ENCOUNTER — Other Ambulatory Visit: Payer: Self-pay

## 2019-10-02 ENCOUNTER — Ambulatory Visit (HOSPITAL_COMMUNITY)
Admission: EM | Admit: 2019-10-02 | Discharge: 2019-10-02 | Disposition: A | Discharge status: Home / Self Care | Payer: HRSA Program | Attending: Emergency Medicine | Admitting: Emergency Medicine

## 2019-10-02 ENCOUNTER — Encounter (HOSPITAL_COMMUNITY): Payer: Self-pay

## 2019-10-02 DIAGNOSIS — I10 Essential (primary) hypertension: Secondary | ICD-10-CM | POA: Diagnosis not present

## 2019-10-02 DIAGNOSIS — Z79899 Other long term (current) drug therapy: Secondary | ICD-10-CM | POA: Diagnosis not present

## 2019-10-02 DIAGNOSIS — E079 Disorder of thyroid, unspecified: Secondary | ICD-10-CM | POA: Diagnosis not present

## 2019-10-02 DIAGNOSIS — F17210 Nicotine dependence, cigarettes, uncomplicated: Secondary | ICD-10-CM | POA: Insufficient documentation

## 2019-10-02 DIAGNOSIS — R05 Cough: Secondary | ICD-10-CM

## 2019-10-02 DIAGNOSIS — U071 COVID-19: Secondary | ICD-10-CM | POA: Diagnosis not present

## 2019-10-02 DIAGNOSIS — Z87442 Personal history of urinary calculi: Secondary | ICD-10-CM | POA: Insufficient documentation

## 2019-10-02 DIAGNOSIS — Z79890 Hormone replacement therapy (postmenopausal): Secondary | ICD-10-CM | POA: Diagnosis not present

## 2019-10-02 DIAGNOSIS — R059 Cough, unspecified: Secondary | ICD-10-CM

## 2019-10-02 HISTORY — DX: Essential (primary) hypertension: I10

## 2019-10-02 HISTORY — DX: Disorder of thyroid, unspecified: E07.9

## 2019-10-02 NOTE — Discharge Instructions (Addendum)
Your COVID test is pending.  You should self quarantine until your test result is back and is negative.   ° °Go to the emergency department if you develop high fever, shortness of breath, severe diarrhea, or other concerning symptoms.   ° °

## 2019-10-02 NOTE — ED Triage Notes (Signed)
Pt presents with ongoing non productive cough, fever, and generalized body aches for over a week.

## 2019-10-02 NOTE — ED Provider Notes (Signed)
[place]    CSN: [id number] Arrival date & time: 10/02/19  1205      History   Chief Complaint Chief Complaint  Patient presents with  . Fever  . Generalized Body Aches  . Cough    HPI Alan Cervantes is a 48 y.o. male.   Patient presents with nonproductive cough, fever, body aches x >1 week.  He request a COVID test.  He states his wife was tested for COVID and received negative results today.  He denies rash, sore throat, shortness of breath, abdominal pain, vomiting, diarrhea, or other symptoms.  The history is provided by the patient.    Past Medical History:  Diagnosis Date  . Chronic joint pain   . Hemorrhoids   . History of kidney stones   . Hypertension   . Left ureteral stone   . Thyroid disease     Patient Active Problem List   Diagnosis Date Noted  . MUSCLE WEAKNESS (GENERALIZED) 04/19/2008  . RECTAL PROLAPSE 03/19/2008  . SHOULDER PAIN, LEFT 03/19/2008  . WRIST PAIN, RIGHT 03/19/2008  . HEMORRHOIDS 12/21/2007  . Calculus of kidney 12/21/2007    Past Surgical History:  Procedure Laterality Date  . CYSTO/  BILATEARL RETROGRADE PYELOGRAM/  RIGHT URETEROSCOPIC LASER LITHOTRIPSY  STONE EXTRACTION/  RIGHT URETERAL STENT PLACEMENT  02-15-2008  . CYSTO/  LEFT URETEROSCOPIC STONE EXTRACTION  05-01-2008  . CYSTOSCOPY WITH URETEROSCOPY AND STENT PLACEMENT Left 05/13/2015   Procedure: CYSTOSCOPY WITH LEFT URETEROSCOPY AND STENT PLACEMENT;  Surgeon: Festus Aloe, MD;  Location: [facility];  Service: Urology;  Laterality: Left;  . EXTRACORPOREAL SHOCK WAVE LITHOTRIPSY Left 03-27-2015  . HOLMIUM LASER APPLICATION Left 9/98/3382   Procedure: HOLMIUM LASER APPLICATION;  Surgeon: Festus Aloe, MD;  Location: [facility];  Service: Urology;  Laterality: Left;  . RECTAL PROLAPSE REPAIR, RECTOPEXY  2012       Home Medications    Prior to Admission medications   Medication Sig Start Date End Date Taking?  Authorizing Provider  atenolol (TENORMIN) 50 MG tablet Take 50 mg by mouth daily.   Yes [provider]  levothyroxine (SYNTHROID) 25 MCG tablet Take 25 mcg by mouth daily before breakfast.   Yes [provider]  loratadine (CLARITIN) 10 MG tablet Take 10 mg by mouth daily as needed for allergies.    [provider]  oxyCODONE-acetaminophen (ROXICET) 5-325 MG per tablet Take 1-2 tablets by mouth every 6 (six) hours as needed for severe pain. 05/13/15   Festus Aloe, MD  tamsulosin (FLOMAX) 0.4 MG CAPS capsule Take 1 capsule (0.4 mg total) by mouth daily after supper. 05/13/15   Festus Aloe, MD    Family History Family History  Family history unknown: Yes    Social History Social History   Tobacco Use  . Smoking status: Current Some Day Smoker    Types: Cigarettes  . Smokeless tobacco: Current User  . Tobacco comment: OCCASIONAL/ SOCIAL  SMOKER AND DIPS  Substance Use Topics  . Alcohol use: Yes  . Drug use: No     Allergies   Patient has no known allergies.   Review of Systems Review of Systems  Constitutional: Positive for fever. Negative for chills.  HENT: Negative for congestion, ear pain, rhinorrhea and sore throat.   Eyes: Negative for pain and visual disturbance.  Respiratory: Positive for cough. Negative for shortness of breath.   Cardiovascular: Negative for chest pain and palpitations.  Gastrointestinal: Negative for abdominal pain and vomiting.  Genitourinary: Negative for dysuria and hematuria.  Musculoskeletal: Negative for arthralgias and back pain.  Skin: Negative for color change and rash.  Neurological: Negative for seizures and syncope.  All other systems reviewed and are negative.    Physical Exam Triage Vital Signs ED Triage Vitals  Enc Vitals Group     BP      Pulse      Resp      Temp      Temp src      SpO2      Weight      Height      Head Circumference      Peak Flow      Pain Score      Pain Loc       Pain Edu?      Excl. in GC?    No data found.  Updated Vital Signs BP 110/76 (BP Location: Right Arm)   Pulse 89   Temp 100 F (37.8 C) (Tympanic)   Resp 17   SpO2 100%   Visual Acuity Right Eye Distance:   Left Eye Distance:   Bilateral Distance:    Right Eye Near:   Left Eye Near:    Bilateral Near:     Physical Exam Vitals signs and nursing note reviewed.  Constitutional:      General: He is not in acute distress.    Appearance: He is well-developed. He is not ill-appearing.  HENT:     Head: Normocephalic and atraumatic.     Right Ear: Tympanic membrane normal.     Left Ear: Tympanic membrane normal.     Nose: Nose normal.     Mouth/Throat:     Mouth: Mucous membranes are moist.     Pharynx: Oropharynx is clear.  Eyes:     Conjunctiva/sclera: Conjunctivae normal.  Neck:     Musculoskeletal: Neck supple.  Cardiovascular:     Rate and Rhythm: Normal rate and regular rhythm.     Heart sounds: No murmur.  Pulmonary:     Effort: Pulmonary effort is normal. No respiratory distress.     Breath sounds: Normal breath sounds.  Abdominal:     General: Bowel sounds are normal.     Palpations: Abdomen is soft.     Tenderness: There is no abdominal tenderness. There is no guarding or rebound.  Skin:    General: Skin is warm and dry.     Findings: No rash.  Neurological:     General: No focal deficit present.     Mental Status: He is alert and oriented to person, place, and time.      UC Treatments / Results  Labs (all labs ordered are listed, but only abnormal results are displayed) Labs Reviewed  NOVEL CORONAVIRUS, NAA (HOSP ORDER, SEND-OUT TO REF LAB; TAT 18-24 HRS)    EKG   Radiology No results found.  Procedures Procedures (including critical care time)  Medications Ordered in UC Medications - No data to display  Initial Impression / Assessment and Plan / UC Course  I have reviewed the triage vital signs and the nursing notes.  Pertinent  labs & imaging results that were available during my care of the patient were reviewed by me and considered in my medical decision making (see chart for details).    Cough.  Patient is well-appearing and his exam is unremarkable.  COVID test performed here.  Instructed patient to self quarantine until the test result is back.  Instructed patient  to go to the emergency department if develops high fever, shortness of breath, severe diarrhea, or other concerning symptoms.  Patient agrees with plan of care.     Final Clinical Impressions(s) / UC Diagnoses   Final diagnoses:  Cough     Discharge Instructions     Your COVID test is pending.  You should self quarantine until your test result is back and is negative.    Go to the emergency department if you develop high fever, shortness of breath, severe diarrhea, or other concerning symptoms.       ED Prescriptions    None     PDMP not reviewed this encounter.   Mickie Bail, NP 10/02/19 1311

## 2019-10-05 ENCOUNTER — Telehealth (HOSPITAL_COMMUNITY): Payer: Self-pay | Admitting: Emergency Medicine

## 2019-10-05 ENCOUNTER — Encounter (HOSPITAL_COMMUNITY): Payer: Self-pay

## 2019-10-05 LAB — NOVEL CORONAVIRUS, NAA (HOSP ORDER, SEND-OUT TO REF LAB; TAT 18-24 HRS): SARS-CoV-2, NAA: DETECTED — AB

## 2019-10-05 NOTE — Telephone Encounter (Signed)
Patient returned call, discuss symptoms and encouraged pt to get a thermometer to check temperature. Pt also states his children are sick, pt given option to have them be seen here or go through drive thru testing. Pt given drive through testing information. All questions answered.

## 2019-10-05 NOTE — Telephone Encounter (Signed)
Positive Covid, attempted to reach patient, no answer, left voicemail on both numbers.  Mychart comment and message sent.

## 2019-10-22 ENCOUNTER — Other Ambulatory Visit: Payer: Self-pay

## 2019-10-22 DIAGNOSIS — Z20822 Contact with and (suspected) exposure to covid-19: Secondary | ICD-10-CM

## 2019-10-25 LAB — NOVEL CORONAVIRUS, NAA: SARS-CoV-2, NAA: NOT DETECTED

## 2020-10-10 ENCOUNTER — Ambulatory Visit (INDEPENDENT_AMBULATORY_CARE_PROVIDER_SITE_OTHER): Payer: Self-pay

## 2020-10-10 ENCOUNTER — Encounter (HOSPITAL_COMMUNITY): Payer: Self-pay | Admitting: Emergency Medicine

## 2020-10-10 ENCOUNTER — Ambulatory Visit (HOSPITAL_COMMUNITY)
Admission: EM | Admit: 2020-10-10 | Discharge: 2020-10-10 | Disposition: A | Discharge status: Home / Self Care | Payer: Self-pay | Attending: Family Medicine | Admitting: Family Medicine

## 2020-10-10 ENCOUNTER — Other Ambulatory Visit: Payer: Self-pay

## 2020-10-10 DIAGNOSIS — S62619P Displaced fracture of proximal phalanx of unspecified finger, subsequent encounter for fracture with malunion: Secondary | ICD-10-CM

## 2020-10-10 DIAGNOSIS — S6991XA Unspecified injury of right wrist, hand and finger(s), initial encounter: Secondary | ICD-10-CM

## 2020-10-10 NOTE — Discharge Instructions (Addendum)
Call Dr Melvyn Novas ( hand specialist) to set up an appointment for next week May take ibuprofen for pain

## 2020-10-10 NOTE — ED Triage Notes (Signed)
Patient c/o RT hand pain x 1 month.   Patient states he was playing soccer when injury occurred.   Patient endorses pain to touch 10/10.  Patient has swelling present to one finger on RT hand.

## 2020-10-10 NOTE — ED Provider Notes (Signed)
[facility]    CSN: [id number] Arrival date & time: 10/10/20  1117      History   Chief Complaint Chief Complaint  Patient presents with  . Hand Injury    HPI Alan Cervantes is a 49 y.o. male.   HPI  Larey Seat playing soccer a month ago.  Describes a deformed finger that he pulled on to straighten.  Still with pain in the 2-3-4 fingers, limited flexion. Limited grip strength.  Normal sensation  Past Medical History:  Diagnosis Date  . Chronic joint pain   . Hemorrhoids   . History of kidney stones   . Hypertension   . Left ureteral stone   . Thyroid disease     Patient Active Problem List   Diagnosis Date Noted  . MUSCLE WEAKNESS (GENERALIZED) 04/19/2008  . RECTAL PROLAPSE 03/19/2008  . SHOULDER PAIN, LEFT 03/19/2008  . WRIST PAIN, RIGHT 03/19/2008  . HEMORRHOIDS 12/21/2007  . Calculus of kidney 12/21/2007    Past Surgical History:  Procedure Laterality Date  . CYSTO/  BILATEARL RETROGRADE PYELOGRAM/  RIGHT URETEROSCOPIC LASER LITHOTRIPSY  STONE EXTRACTION/  RIGHT URETERAL STENT PLACEMENT  02-15-2008  . CYSTO/  LEFT URETEROSCOPIC STONE EXTRACTION  05-01-2008  . CYSTOSCOPY WITH URETEROSCOPY AND STENT PLACEMENT Left 05/13/2015   Procedure: CYSTOSCOPY WITH LEFT URETEROSCOPY AND STENT PLACEMENT;  Surgeon: Jerilee Field, MD;  Location: [facility];  Service: Urology;  Laterality: Left;  . EXTRACORPOREAL SHOCK WAVE LITHOTRIPSY Left 03-27-2015  . HOLMIUM LASER APPLICATION Left 05/13/2015   Procedure: HOLMIUM LASER APPLICATION;  Surgeon: Jerilee Field, MD;  Location: [facility];  Service: Urology;  Laterality: Left;  . RECTAL PROLAPSE REPAIR, RECTOPEXY  2012       Home Medications    Prior to Admission medications   Medication Sig Start Date End Date Taking? Authorizing Provider  atenolol (TENORMIN) 50 MG tablet Take 50 mg by mouth daily.   Yes [provider]  levothyroxine (SYNTHROID) 25 MCG tablet Take 25 mcg  by mouth daily before breakfast.   Yes [provider]  loratadine (CLARITIN) 10 MG tablet Take 10 mg by mouth daily as needed for allergies.    [provider]  oxyCODONE-acetaminophen (ROXICET) 5-325 MG per tablet Take 1-2 tablets by mouth every 6 (six) hours as needed for severe pain. 05/13/15   Jerilee Field, MD  tamsulosin (FLOMAX) 0.4 MG CAPS capsule Take 1 capsule (0.4 mg total) by mouth daily after supper. 05/13/15   Jerilee Field, MD    Family History Family History  Family history unknown: Yes    Social History Social History   Tobacco Use  . Smoking status: Current Some Day Smoker    Types: Cigarettes  . Smokeless tobacco: Current User  . Tobacco comment: OCCASIONAL/ SOCIAL  SMOKER AND DIPS  Substance Use Topics  . Alcohol use: Yes  . Drug use: No     Allergies   Patient has no known allergies.   Review of Systems Review of Systems See HPI  Physical Exam Triage Vital Signs ED Triage Vitals  Enc Vitals Group     BP 10/10/20 1206 123/82     Pulse Rate 10/10/20 1206 62     Resp 10/10/20 1206 15     Temp 10/10/20 1206 98.1 F (36.7 C)     Temp Source 10/10/20 1206 Oral     SpO2 10/10/20 1206 98 %     Weight 10/10/20 1203 138 lb 7.2 oz (62.8 kg)  Height 10/10/20 1203 5\' 5"  (1.651 m)     Head Circumference --      Peak Flow --      Pain Score 10/10/20 1202 10     Pain Loc --      Pain Edu? --      Excl. in GC? --    No data found.  Updated Vital Signs BP 123/82 (BP Location: Left Arm)   Pulse 62   Temp 98.1 F (36.7 C) (Oral)   Resp 15   Ht 5\' 5"  (1.651 m)   Wt 62.8 kg   SpO2 98%   BMI 23.04 kg/m     Physical Exam Constitutional:      General: He is not in acute distress.    Appearance: He is well-developed.  HENT:     Head: Normocephalic and atraumatic.     Mouth/Throat:     Comments: Mask is in place Eyes:     Conjunctiva/sclera: Conjunctivae normal.     Pupils: Pupils are equal, round, and reactive to  light.  Cardiovascular:     Rate and Rhythm: Normal rate.  Pulmonary:     Effort: Pulmonary effort is normal. No respiratory distress.  Abdominal:     Palpations: Abdomen is soft.  Musculoskeletal:     Right hand: Swelling and tenderness present. Decreased range of motion.     Cervical back: Normal range of motion.     Comments: The ring finger has a tubular swelling, most concentrated around the PIP. Pain tenderness palpation around PIP. Very limited flexion. Full extension. The index and long fingers also have some slight swelling and limited flexion. No tenderness in the metacarpals. Normal sensory exam of tips  Skin:    General: Skin is warm and dry.  Neurological:     Mental Status: He is alert.      UC Treatments / Results  Labs (all labs ordered are listed, but only abnormal results are displayed) Labs Reviewed - No data to display  EKG   Radiology DG Hand Complete Right  Result Date: 10/10/2020 CLINICAL DATA:  Injury during soccer match EXAM: RIGHT HAND - COMPLETE 3+ VIEW COMPARISON:  None. FINDINGS: Frontal, oblique, and lateral views obtained. There is a comminuted fracture through the proximal to mid aspects of the fourth proximal phalanx with mild displacement of fracture fragments. There is suggestion of early callus formation in this area indicating subacute type injury. There is evidence of fracture along the proximal most aspect of the scaphoid bone. Small bony fragments are noted dorsal to the proximal scaphoid on lateral view. No other fractures are evident. No dislocation. Joint spaces appear normal. No erosive change. IMPRESSION: 1. Subacute appearing fracture involving portions of the fourth proximal phalanx with mild displacement of fracture fragments. 2. Apparent fracture along the proximal most aspect of the scaphoid with small bony fragment seen dorsally on the lateral view in this area, likely representing avulsion type injury in this region. 3. No other  fracture evident. No dislocation. No appreciable arthropathic change. These results will be called to the ordering clinician or representative by the Radiologist Assistant, and communication documented in the PACS or . Electronically Signed   By: 10/12/2020 III M.D.   On: 10/10/2020 12:34    Procedures Procedures (including critical care time)  Medications Ordered in UC Medications - No data to display  Initial Impression / Assessment and Plan / UC Course  I have reviewed the triage vital signs and the nursing notes.  Pertinent labs & imaging results that were available during my care of the patient were reviewed by me and considered in my medical decision making (see chart for details).     Referred to hand specialty for follow-up Final Clinical Impressions(s) / UC Diagnoses   Final diagnoses:  Closed fracture of base of proximal phalanx of finger with malunion     Discharge Instructions     Call Dr Melvyn Novas ( hand specialist) to set up an appointment for next week May take ibuprofen for pain    ED Prescriptions    None     PDMP not reviewed this encounter.   Eustace Moore, MD 10/10/20 1415

## 2022-06-20 IMAGING — DX DG HAND COMPLETE 3+V*R*
3 series · 3 of 3 positions shown · non-contrast
Comparison: None.

CLINICAL DATA: Injury during soccer match

EXAM:
RIGHT HAND - COMPLETE 3+ VIEW

[hand pa]
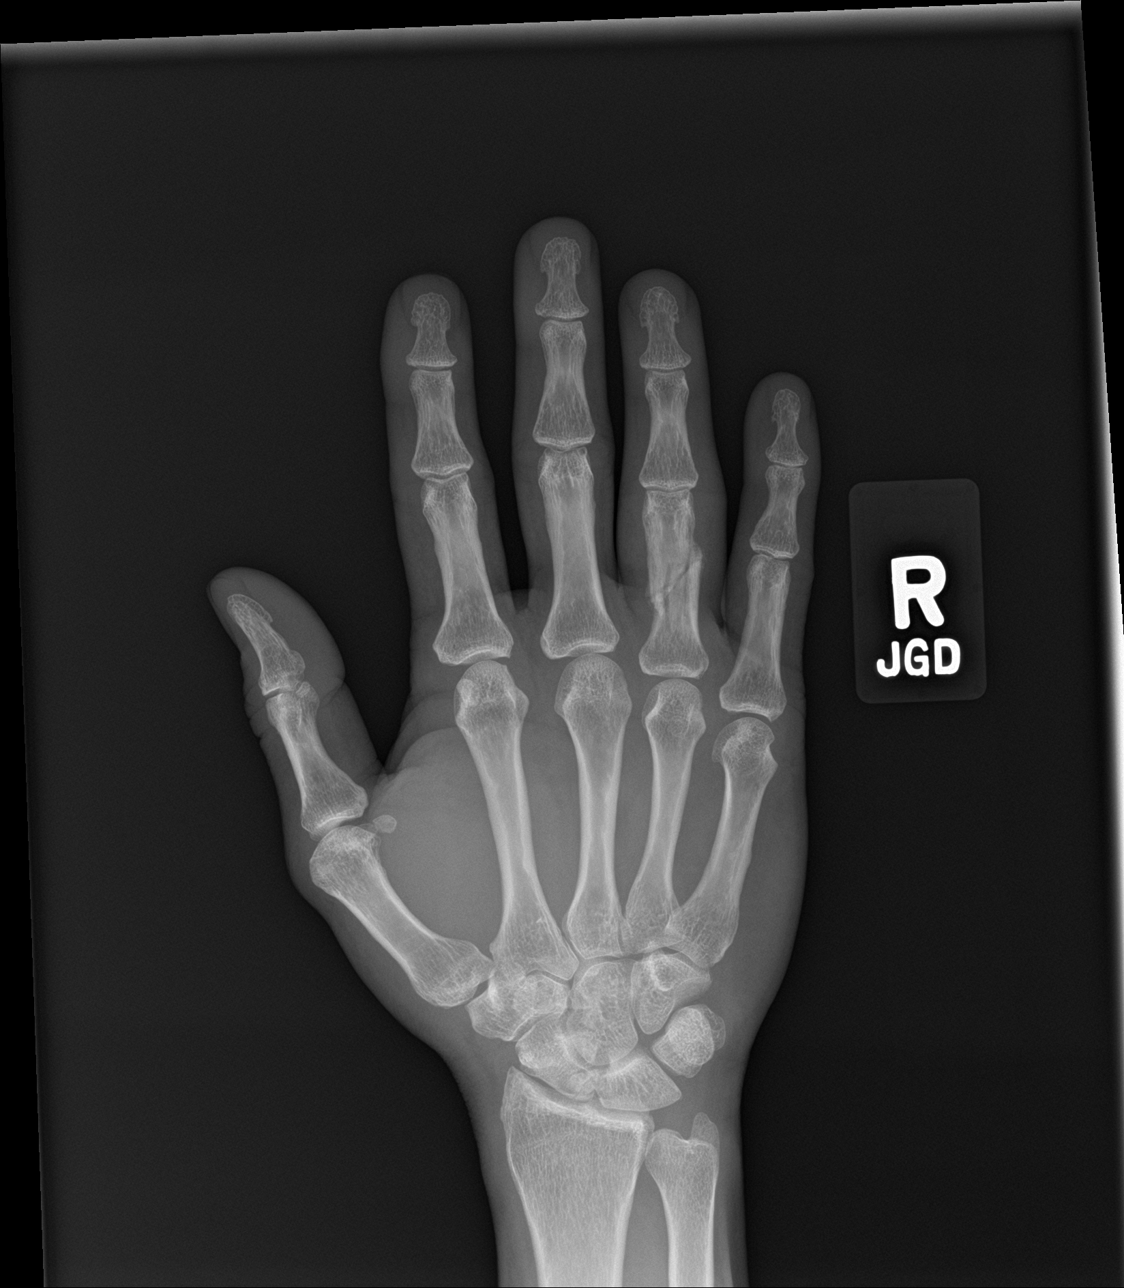

[hand obl]
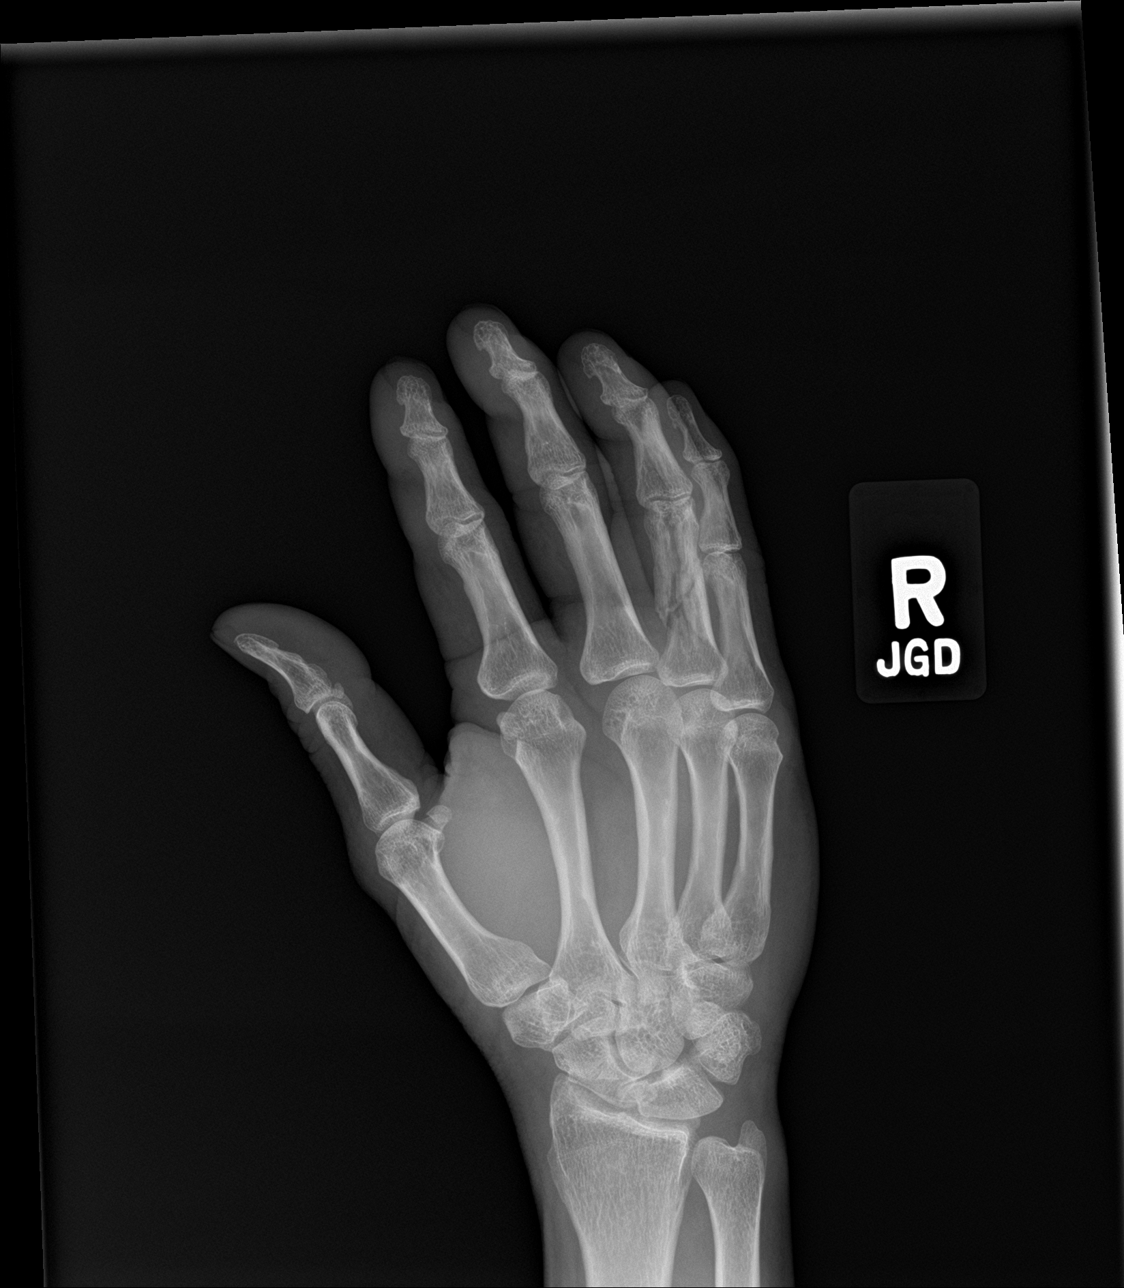

[hand lat]
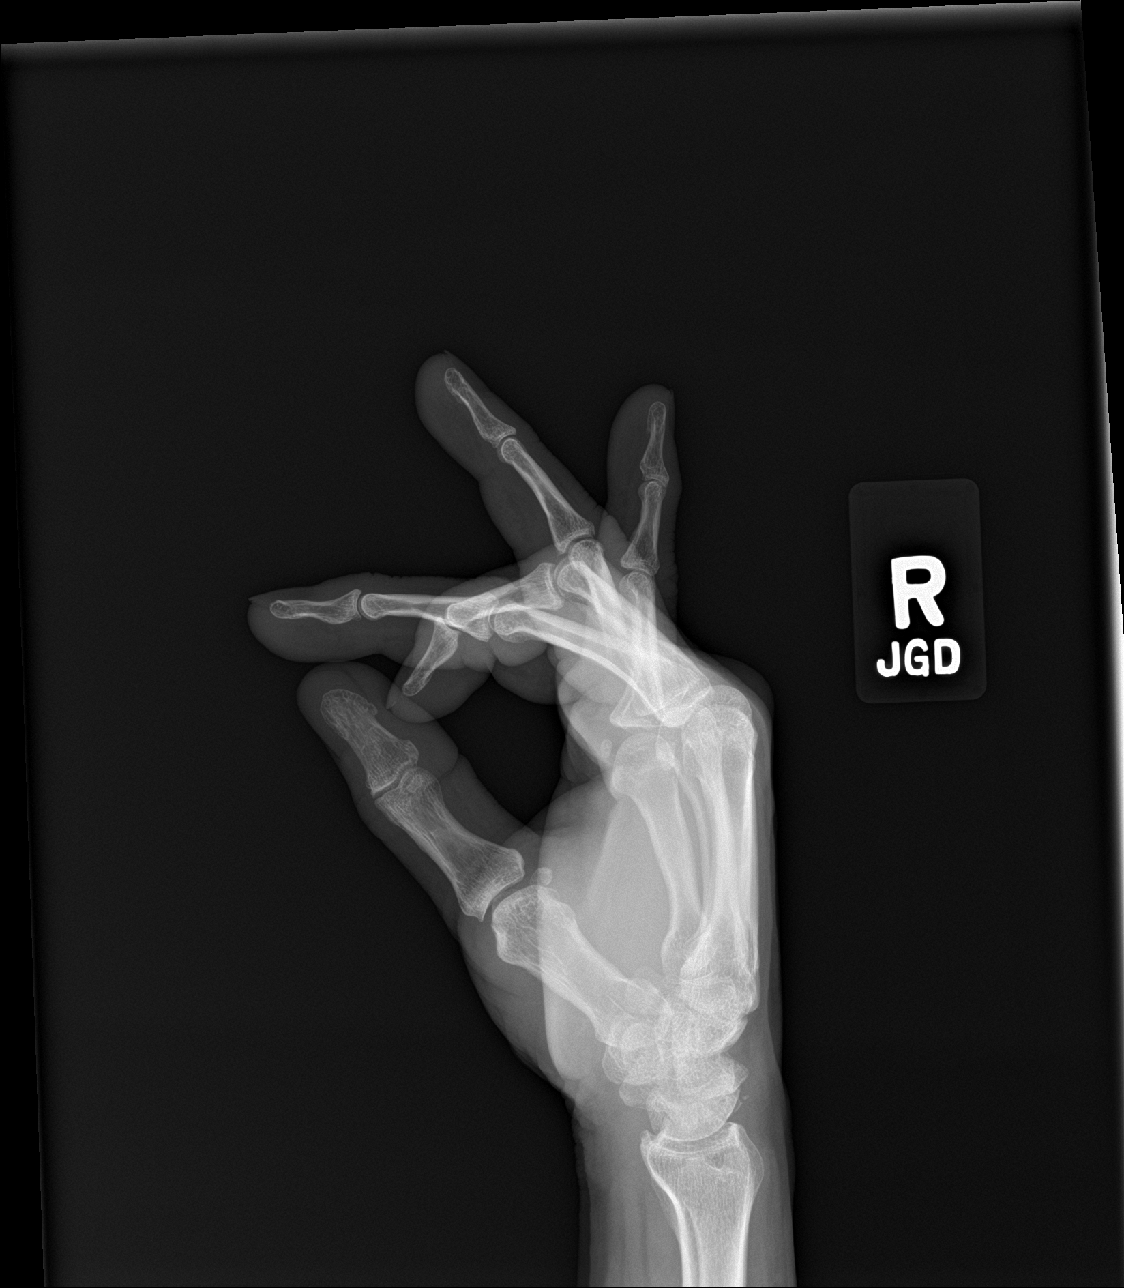

[3 of 3 positions shown; findings below may reference images not displayed]

FINDINGS: Frontal, oblique, and lateral views obtained. There is a comminuted
fracture through the proximal to mid aspects of the fourth proximal
phalanx with mild displacement of fracture fragments. There is
suggestion of early callus formation in this area indicating
subacute type injury.

There is evidence of fracture along the proximal most aspect of the
scaphoid bone. Small bony fragments are noted dorsal to the proximal
scaphoid on lateral view.

No other fractures are evident. No dislocation. Joint spaces appear
normal. No erosive change.
IMPRESSION: 1. Subacute appearing fracture involving portions of the fourth
proximal phalanx with mild displacement of fracture fragments.

2. Apparent fracture along the proximal most aspect of the scaphoid
with small bony fragment seen dorsally on the lateral view in this
area, likely representing avulsion type injury in this region.

3. No other fracture evident. No dislocation. No appreciable
arthropathic change.

These results will be called to the ordering clinician or
representative by the Radiologist Assistant, and communication
documented in the PACS or [REDACTED].

## 2024-09-20 ENCOUNTER — Encounter (HOSPITAL_COMMUNITY): Payer: Self-pay

## 2024-09-20 ENCOUNTER — Ambulatory Visit (HOSPITAL_COMMUNITY)
Admission: EM | Admit: 2024-09-20 | Discharge: 2024-09-20 | Disposition: A | Discharge status: Home / Self Care | Payer: Self-pay | Attending: Student | Admitting: Student

## 2024-09-20 ENCOUNTER — Ambulatory Visit (INDEPENDENT_AMBULATORY_CARE_PROVIDER_SITE_OTHER): Payer: Self-pay

## 2024-09-20 DIAGNOSIS — M79641 Pain in right hand: Secondary | ICD-10-CM

## 2024-09-20 DIAGNOSIS — S62034A Nondisplaced fracture of proximal third of navicular [scaphoid] bone of right wrist, initial encounter for closed fracture: Secondary | ICD-10-CM

## 2024-09-20 NOTE — ED Provider Notes (Signed)
 [facility]    CSN: [id number] Arrival date & time: 09/20/24  1405      History   Chief Complaint Chief Complaint  Patient presents with   Finger Injury    HPI Alan Cervantes is a 53 y.o. male.   Patient presents for evaluation of injury to right hand that occurred about a month ago while playing soccer.  Patient states that someone kicked a soccer ball directly into his right thumb and he reports he has had pain since then.  Patient states that the pain has increased over the last week and he has also had some reduced strength and range of motion of his right hand due to this.  Patient denies any other injuries from this incident.  Patient denies numbness and tingling.  The history is provided by the patient and medical records.    Past Medical History:  Diagnosis Date   Chronic joint pain    Hemorrhoids    History of kidney stones    Hypertension    Left ureteral stone    Thyroid disease     Patient Active Problem List   Diagnosis Date Noted   MUSCLE WEAKNESS (GENERALIZED) 04/19/2008   RECTAL PROLAPSE 03/19/2008   SHOULDER PAIN, LEFT 03/19/2008   WRIST PAIN, RIGHT 03/19/2008   Hemorrhoids 12/21/2007   Calculus of kidney 12/21/2007    Past Surgical History:  Procedure Laterality Date   CYSTO/  BILATEARL RETROGRADE PYELOGRAM/  RIGHT URETEROSCOPIC LASER LITHOTRIPSY  STONE EXTRACTION/  RIGHT URETERAL STENT PLACEMENT  02/15/2008   CYSTO/  LEFT URETEROSCOPIC STONE EXTRACTION  05/01/2008   CYSTOSCOPY WITH URETEROSCOPY AND STENT PLACEMENT Left 05/13/2015   Procedure: CYSTOSCOPY WITH LEFT URETEROSCOPY AND STENT PLACEMENT;  Surgeon: Donnice Brooks, MD;  Location: [facility];  Service: Urology;  Laterality: Left;   EXTRACORPOREAL SHOCK WAVE LITHOTRIPSY Left 03/27/2015   FINGER SURGERY     HOLMIUM LASER APPLICATION Left 05/13/2015   Procedure: HOLMIUM LASER APPLICATION;  Surgeon: Donnice Brooks, MD;  Location: [facility];   Service: Urology;  Laterality: Left;   RECTAL PROLAPSE REPAIR, RECTOPEXY  12/13/2010       Home Medications    Prior to Admission medications   Medication Sig Start Date End Date Taking? Authorizing Provider  atenolol (TENORMIN) 50 MG tablet Take 50 mg by mouth daily.   Yes [provider]  levothyroxine (SYNTHROID) 25 MCG tablet Take 25 mcg by mouth daily before breakfast.   Yes [provider]  tamsulosin  (FLOMAX ) 0.4 MG CAPS capsule Take 1 capsule (0.4 mg total) by mouth daily after supper. 05/13/15   Brooks Donnice, MD    Family History Family History  Family history unknown: Yes    Social History Social History   Tobacco Use   Smoking status: Former    Types: Cigarettes   Smokeless tobacco: Current   Tobacco comments:    OCCASIONAL/ SOCIAL  SMOKER AND DIPS  Vaping Use   Vaping status: Never Used  Substance Use Topics   Alcohol use: Yes   Drug use: No     Allergies   Patient has no known allergies.   Review of Systems Review of Systems  Per HPI  Physical Exam Triage Vital Signs ED Triage Vitals  Encounter Vitals Group     BP 09/20/24 1517 124/82     Girls Systolic BP Percentile --      Girls Diastolic BP Percentile --      Boys Systolic BP Percentile --  Boys Diastolic BP Percentile --      Pulse Rate 09/20/24 1517 (!) 54     Resp 09/20/24 1517 18     Temp 09/20/24 1517 98.9 F (37.2 C)     Temp Source 09/20/24 1517 Oral     SpO2 09/20/24 1517 94 %     Weight 09/20/24 1516 145 lb (65.8 kg)     Height 09/20/24 1516 5' 5 (1.651 m)     Head Circumference --      Peak Flow --      Pain Score 09/20/24 1514 8     Pain Loc --      Pain Education --      Exclude from Growth Chart --    No data found.  Updated Vital Signs BP 124/82 (BP Location: Right Arm)   Pulse (!) 54   Temp 98.9 F (37.2 C) (Oral)   Resp 18   Ht 5' 5 (1.651 m)   Wt 145 lb (65.8 kg)   SpO2 94%   BMI 24.13 kg/m   Visual Acuity Right Eye  Distance:   Left Eye Distance:   Bilateral Distance:    Right Eye Near:   Left Eye Near:    Bilateral Near:     Physical Exam Vitals and nursing note reviewed.  Constitutional:      General: He is awake. He is not in acute distress.    Appearance: Normal appearance. He is well-developed and well-groomed. He is not ill-appearing.  Musculoskeletal:     Right wrist: Tenderness, bony tenderness and snuff box tenderness present. No swelling or deformity. Decreased range of motion. Normal pulse.     Right hand: No swelling or deformity. Decreased range of motion. Normal sensation. There is no disruption of two-point discrimination. Normal capillary refill. Normal pulse.  Skin:    General: Skin is warm and dry.  Neurological:     Mental Status: He is alert.  Psychiatric:        Behavior: Behavior is cooperative.      UC Treatments / Results  Labs (all labs ordered are listed, but only abnormal results are displayed) Labs Reviewed - No data to display  EKG   Radiology DG Hand Complete Right Result Date: 09/20/2024 CLINICAL DATA:  Jammed thumb 1 month ago. EXAM: RIGHT HAND - COMPLETE 3+ VIEW COMPARISON:  Radiograph 10/10/2020 FINDINGS: There is no evidence of acute or healing fracture. Remote fracture of the proximal scaphoid. Plate and screw fixation of previous fourth proximal phalanx fracture. Mild radiocarpal degenerative change. Soft tissues are unremarkable. IMPRESSION: 1. No acute or healing fracture of the right hand, particularly the thumb. 2. Remote fracture of the proximal scaphoid. Electronically Signed   By: Andrea Gasman M.D.   On: 09/20/2024 15:43    Procedures Procedures (including critical care time)  Medications Ordered in UC Medications - No data to display  Initial Impression / Assessment and Plan / UC Course  I have reviewed the triage vital signs and the nursing notes.  Pertinent labs & imaging results that were available during my care of the patient  were reviewed by me and considered in my medical decision making (see chart for details).     Patient is overall well-appearing.  Vitals are stable.  X-ray ordered.  Based my interpretation there does appear to be a fracture of the proximal scaphoid.  Radiology report confirms this.  Thumb spica splint ordered.  Given orthopedic follow-up.  Recommend ibuprofen and Tylenol  as needed for  pain.  Discussed follow-up and return precautions. Final Clinical Impressions(s) / UC Diagnoses   Final diagnoses:  Pain of right hand  Closed nondisplaced fracture of proximal third of scaphoid bone of right wrist, initial encounter     Discharge Instructions      Your x-ray did reveal a fracture to the the scaphoid bone of your right wrist. We have placed you in a splint today to help stabilize this. Call [place] [facility] to schedule an appointment as soon as possible to follow-up on this injury. Alternate between 400 to 600 mg of ibuprofen and 500 to 1000 mg of Tylenol  every 6-8 hours as needed for pain. Follow-up with your primary care provider or return here as needed.   ED Prescriptions   None    PDMP not reviewed this encounter.   Johnie Flaming A, NP 09/20/24 [phone]

## 2024-09-20 NOTE — ED Notes (Signed)
 Ortho tech called and states will be in route soon to place splint.

## 2024-09-20 NOTE — ED Triage Notes (Signed)
 A month ago while playing soccer Patient injured the right thumb. States it was pressed inward, jamming the thumb. Continues to have pain. Has reduced strength and range of motion in the right hand.

## 2024-09-20 NOTE — Progress Notes (Signed)
 Orthopedic Tech Progress Note Patient Details:  Javin Nong [birth year] [id number]  Ortho Devices Type of Ortho Device: Thumb spica splint Splint Material: Fiberglass Ortho Device/Splint Location: RUE Ortho Device/Splint Interventions: Ordered, Application, Adjustment   Post Interventions Patient Tolerated: Well Instructions Provided: Care of device, Adjustment of device  Betti Goodenow Ronal Brasil 09/20/2024, 5:32 PM

## 2024-09-20 NOTE — Discharge Instructions (Addendum)
 Your x-ray did reveal a fracture to the the scaphoid bone of your right wrist. We have placed you in a splint today to help stabilize this. Call [place] [facility] to schedule an appointment as soon as possible to follow-up on this injury. Alternate between 400 to 600 mg of ibuprofen and 500 to 1000 mg of Tylenol  every 6-8 hours as needed for pain. Follow-up with your primary care provider or return here as needed.

## 2024-09-25 ENCOUNTER — Encounter: Payer: Self-pay | Admitting: Family Medicine

## 2024-09-25 ENCOUNTER — Ambulatory Visit (INDEPENDENT_AMBULATORY_CARE_PROVIDER_SITE_OTHER): Payer: Self-pay | Admitting: Family Medicine

## 2024-09-25 VITALS — BP 100/70 | Ht 65.0 in | Wt 140.0 lb

## 2024-09-25 DIAGNOSIS — S62034A Nondisplaced fracture of proximal third of navicular [scaphoid] bone of right wrist, initial encounter for closed fracture: Secondary | ICD-10-CM | POA: Diagnosis not present

## 2024-09-25 NOTE — Patient Instructions (Signed)
 Prentice MICAEL Shari MADISON, MD [facility] [address], [place], [place] [address] Phone: [phone]

## 2024-09-25 NOTE — Progress Notes (Signed)
 DATE OF VISIT: 09/25/2024        Alan Cervantes DOB: 09-Mar-1971 MRN: [id number]  Discussed the use of AI scribe software for clinical note transcription with the patient, who gave verbal consent to proceed.  History of Present Illness Alan Cervantes is a 53 year old male who presents with worsening pain in the right hand. He was referred by urgent care for further evaluation of his right hand injury. RHD  Right hand pain - Worsening pain in the right hand since initial injury on September 1st while playing soccer, when a soccer ball struck the hand - Pain has progressively increased, prompting urgent care visit on October 9th - Xray at that time showing proximal pole scaphoid fracture.  Was placed in thumb spica splint. - No associated swelling, bruising, numbness, or tingling - Pain persists despite use of a brace, which may help control movement - No use of pain medication  Prior right hand injury and surgical history - Similar injury to the same area of the right hand four years ago, also sustained while playing soccer  - Xray of the Rt hand 10/10/20 showed subacute fx of the 4th proximal phalanx, along with fracture along proximal pole of the scaphoid  - Surgical intervention at that time included ORIF of the right 4th finger with Dr Ahmad with [facility] - He does not recall specific discussion about scaphoid in 2021   Medications:  Outpatient Encounter Medications as of 09/25/2024  Medication Sig   atenolol (TENORMIN) 50 MG tablet Take 50 mg by mouth daily.   levothyroxine (SYNTHROID) 25 MCG tablet Take 25 mcg by mouth daily before breakfast.   tamsulosin  (FLOMAX ) 0.4 MG CAPS capsule Take 1 capsule (0.4 mg total) by mouth daily after supper.   No facility-administered encounter medications on file as of 09/25/2024.    Allergies: has no known allergies.  Physical Examination: Vitals: BP 100/70   Ht 5' 5 (1.651 m)   Wt 140 lb (63.5 kg)   BMI 23.30 kg/m  GENERAL:  Alan Cervantes is a 53 y.o. male appearing their stated age, alert and oriented x 3, in no apparent distress.  SKIN: no rashes or lesions, skin clean, dry, intact MSK: Hand/wrist: Right wrist initially and thumb spica splint.  This was removed for evaluation today.  Tender to palpation over the anatomic snuffbox.  Minimal swelling along the radial aspect of the wrist.  No bruising.  Good range of motion with some pain along the radial aspect of the wrist.  Normal grip strength.  Well-healed surgical scar along the dorsal aspect of the fourth finger. NEURO: sensation intact to light touch extremity bilaterally VASC: pulses 2+ and symmetric artery bilaterally, no edema  Radiology: Right Hand x-ray 09/20/2024 personally reviewed and interpreted by me today showing: - Signs of remote proximal pole scaphoid fracture.  Compared to previous right hand x-ray 10/10/2020 showing proximal pole scaphoid fracture. - Prior plates and screw from ORIF of the fourth proximal phalanx - Mild radiocarpal degenerative changes  Right Hand x-ray 10/10/2020 personally today showing: IMPRESSION: 1. Subacute appearing fracture involving portions of the fourth proximal phalanx with mild displacement of fracture fragments.  2. Apparent fracture along the proximal most aspect of the scaphoid with small bony fragment seen dorsally on the lateral view in this area, likely representing avulsion type injury in this region.  3. No other fracture evident. No dislocation. No appreciable arthropathic change.  Assessment & Plan Acute right hand pain with x-ray showing chronic nonunion of  proximal scaphoid fracture.  Likely acute exacerbation of prior fracture that was present in 2021  - Urgent care notes and x-rays reviewed with patient in detail. X-ray indicates likely nonunion or possible fibrous union. Limited blood supply complicates healing to that area.  Due to potential complications of this injury, will refer to Dr. Prentice Pagan  at [facility] for further evaluation and treatment.  Dr. Pagan had completed the surgery on his right fourth finger in 2021 - Fit with a new removable thumb spica brace for support - Advise wearing the brace most of the time, Alan Cervantes to remove for shower - Instruct to contact office if pain increases or issues arise before seeing Dr. Pagan.     Patient expressed understanding & agreement with above.  Encounter Diagnosis  Name Primary?   Closed nondisplaced fracture of proximal third of scaphoid bone of right wrist, initial encounter Yes    No orders of the defined types were placed in this encounter.    VISIT SUMMARY: You came in today due to worsening pain in your right hand, which started after a soccer injury on September 1st. You have a history of a similar injury to the same hand four years ago, which required surgery and the placement of a steel plate.  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 This means that the bone has not healed properly and has limited blood supply, making healing difficult. We will manage your pain and inflammation conservatively for now, but surgery may be considered if these measures do not help. You have been referred to Dr. Fred Ortman at [facility] for further evaluation and management. You have also been fitted with a new removable brace for support, which you should wear most of the time, including while showering. Please contact our office if your pain increases or if you have any issues before seeing Dr. Ortman.  -STATUS POST RIGHT FOURTH FINGER SURGERY WITH RETAINED HARDWARE: You have previously had surgery on your right fourth finger, and a steel plate was placed in your hand during that surgery. This hardware is still present in your hand.  INSTRUCTIONS: Please follow up with Dr. Prentice Charter at [facility] for further evaluation and management of your right hand. Wear the new removable brace most of the time, including while showering. Contact our office if your pain increases or if you have any issues before your appointment with Dr. Ortman. Contains text generated by [employer]bridge.

## 2024-10-10 DIAGNOSIS — S62033A Displaced fracture of proximal third of navicular [scaphoid] bone of unspecified wrist, initial encounter for closed fracture: Secondary | ICD-10-CM | POA: Insufficient documentation

## 2024-11-06 ENCOUNTER — Ambulatory Visit (INDEPENDENT_AMBULATORY_CARE_PROVIDER_SITE_OTHER)

## 2024-11-06 ENCOUNTER — Ambulatory Visit (HOSPITAL_COMMUNITY)
Admission: EM | Admit: 2024-11-06 | Discharge: 2024-11-06 | Disposition: A | Discharge status: Home / Self Care | Attending: Physician Assistant | Admitting: Physician Assistant

## 2024-11-06 ENCOUNTER — Encounter (HOSPITAL_COMMUNITY): Payer: Self-pay

## 2024-11-06 DIAGNOSIS — I10 Essential (primary) hypertension: Secondary | ICD-10-CM | POA: Diagnosis not present

## 2024-11-06 DIAGNOSIS — M25561 Pain in right knee: Secondary | ICD-10-CM | POA: Diagnosis not present

## 2024-11-06 DIAGNOSIS — Z760 Encounter for issue of repeat prescription: Secondary | ICD-10-CM

## 2024-11-06 MED ORDER — ATENOLOL 50 MG PO TABS: 50.0000 mg | ORAL_TABLET | Freq: Every day | ORAL | 0 refills | Status: DC

## 2024-11-06 NOTE — Discharge Instructions (Addendum)
 I am still waiting on radiology to review your imaging but I do not see any obvious signs of an acute fracture or dislocation of your right knee.  We have supplied you with a knee brace to help with stabilization and I recommend that you alternate Tylenol  and ibuprofen as needed for pain control.  You can also use warm compresses and gentle massage to the area as needed to assist with pain.  If you feel like your symptoms are not improving or seem to be getting worse I recommend following up with orthopedics.  I have compiled the contact information for several orthopedic clinics in the area for you to utilize if you need.  A refill of your atenolol  has been sent into the pharmacy on file.  Please make sure that you establish and follow-up with a primary care provider for ongoing medication management and recommended lab work.  [facility] [address], [place], [place] [address] [phone]  [facility] [address] [place], [place] [address]  [phone]

## 2024-11-06 NOTE — ED Triage Notes (Signed)
 Pt states out of his atenolol  50mg  x2 days.  Pt c/o rt knee pain from when he hurt it playing soccer on 11/08. States been icing knee in the mornings.

## 2024-11-06 NOTE — ED Provider Notes (Signed)
 [facility]    CSN: [id number] Arrival date & time: 11/06/24  1206      History   Chief Complaint Chief Complaint  Patient presents with   Medication Refill   Knee Pain    HPI Alan Cervantes is a 53 y.o. male.  has a past medical history of Chronic joint pain, Hemorrhoids, History of kidney stones, Hypertension, Left ureteral stone, and Thyroid disease.   HPI  Discussed the use of AI scribe software for clinical note transcription with the patient, who gave verbal consent to proceed.  The patient presents with knee pain and a need for blood pressure medication refill.  He has been experiencing knee pain since November 8th after being hit while playing soccer. The pain is described as a 'very soft pain' that occurs intermittently in the inner portion of the right knee. There is concern about potential bone injury, but no swelling or bruising has been noted. The pain is exacerbated by certain movements, and he has not taken any medication for it.  He requires a refill of his blood pressure medication, atenolol . Having recently moved from [place], [place]  to [place], he has not yet established care with a new primary care provider. He has run out of his medication and needs assistance in obtaining a new prescription.   Past Medical History:  Diagnosis Date   Chronic joint pain    Hemorrhoids    History of kidney stones    Hypertension    Left ureteral stone    Thyroid disease     Patient Active Problem List   Diagnosis Date Noted   MUSCLE WEAKNESS (GENERALIZED) 04/19/2008   RECTAL PROLAPSE 03/19/2008   SHOULDER PAIN, LEFT 03/19/2008   WRIST PAIN, RIGHT 03/19/2008   Hemorrhoids 12/21/2007   Calculus of kidney 12/21/2007    Past Surgical History:  Procedure Laterality Date   CYSTO/  BILATEARL RETROGRADE PYELOGRAM/  RIGHT URETEROSCOPIC LASER LITHOTRIPSY  STONE EXTRACTION/  RIGHT URETERAL STENT PLACEMENT  02/15/2008   CYSTO/  LEFT URETEROSCOPIC  STONE EXTRACTION  05/01/2008   CYSTOSCOPY WITH URETEROSCOPY AND STENT PLACEMENT Left 05/13/2015   Procedure: CYSTOSCOPY WITH LEFT URETEROSCOPY AND STENT PLACEMENT;  Surgeon: Donnice Brooks, MD;  Location: [facility];  Service: Urology;  Laterality: Left;   EXTRACORPOREAL SHOCK WAVE LITHOTRIPSY Left 03/27/2015   FINGER SURGERY     HOLMIUM LASER APPLICATION Left 05/13/2015   Procedure: HOLMIUM LASER APPLICATION;  Surgeon: Donnice Brooks, MD;  Location[facility];  Service: Urology;  Laterality: Left;   RECTAL PROLAPSE REPAIR, RECTOPEXY  12/13/2010       Home Medications    Prior to Admission medications   Medication Sig Start Date End Date Taking? Authorizing Provider  atenolol  (TENORMIN ) 50 MG tablet Take 1 tablet (50 mg total) by mouth daily. 11/06/24  Yes Nyelle Wolfson E, PA-C  atenolol  (TENORMIN ) 50 MG tablet Take 50 mg by mouth daily.    [provider]  levothyroxine (SYNTHROID) 25 MCG tablet Take 25 mcg by mouth daily before breakfast.    [provider]  tamsulosin  (FLOMAX ) 0.4 MG CAPS capsule Take 1 capsule (0.4 mg total) by mouth daily after supper. 05/13/15   Brooks Donnice, MD    Family History Family History  Family history unknown: Yes    Social History Social History   Tobacco Use   Smoking status: Former    Types: Cigarettes   Smokeless tobacco: Current   Tobacco comments:    OCCASIONAL/ SOCIAL  SMOKER  AND DIPS  Vaping Use   Vaping status: Never Used  Substance Use Topics   Alcohol use: Yes   Drug use: No     Allergies   Patient has no known allergies.   Review of Systems Review of Systems  Musculoskeletal:  Positive for arthralgias (right knee pain).     Physical Exam Triage Vital Signs ED Triage Vitals  Encounter Vitals Group     BP 11/06/24 1317 119/80     Girls Systolic BP Percentile --      Girls Diastolic BP Percentile --      Boys Systolic BP Percentile --      Boys Diastolic BP  Percentile --      Pulse Rate 11/06/24 1317 62     Resp 11/06/24 1317 18     Temp 11/06/24 1317 98.1 F (36.7 C)     Temp Source 11/06/24 1317 Oral     SpO2 11/06/24 1317 96 %     Weight --      Height --      Head Circumference --      Peak Flow --      Pain Score 11/06/24 1315 0     Pain Loc --      Pain Education --      Exclude from Growth Chart --    No data found.  Updated Vital Signs BP 119/80 (BP Location: Right Arm)   Pulse 62   Temp 98.1 F (36.7 C) (Oral)   Resp 18   SpO2 96%   Visual Acuity Right Eye Distance:   Left Eye Distance:   Bilateral Distance:    Right Eye Near:   Left Eye Near:    Bilateral Near:     Physical Exam Vitals reviewed.  Constitutional:      General: He is awake.     Appearance: Normal appearance. He is well-developed and well-groomed.  HENT:     Head: Normocephalic and atraumatic.  Eyes:     Extraocular Movements: Extraocular movements intact.     Conjunctiva/sclera: Conjunctivae normal.  Pulmonary:     Effort: Pulmonary effort is normal.  Musculoskeletal:     Cervical back: Normal range of motion.     Right knee: No swelling, effusion, erythema, ecchymosis or crepitus. Normal range of motion. No tenderness. No LCL laxity, MCL laxity, ACL laxity or PCL laxity. Normal alignment.     Instability Tests: Anterior drawer test negative. Posterior drawer test negative. Medial McMurray test negative and lateral McMurray test negative.     Comments: Negative Apley grind test on the right knee   Neurological:     Mental Status: He is alert and oriented to person, place, and time.  Psychiatric:        Attention and Perception: Attention normal.        Mood and Affect: Mood normal.        Speech: Speech normal.        Behavior: Behavior normal. Behavior is cooperative.      UC Treatments / Results  Labs (all labs ordered are listed, but only abnormal results are displayed) Labs Reviewed - No data to  display  EKG   Radiology No results found.  Procedures Procedures (including critical care time)  Medications Ordered in UC Medications - No data to display  Initial Impression / Assessment and Plan / UC Course  I have reviewed the triage vital signs and the nursing notes.  Pertinent labs & imaging results that were  available during my care of the patient were reviewed by me and considered in my medical decision making (see chart for details).      Final Clinical Impressions(s) / UC Diagnoses   Final diagnoses:  Acute pain of right knee  Medication refill  Hypertension, unspecified type   Right knee pain after soccer injury Right knee pain since November 8th, 2025, following a soccer injury. Pain is soft and sometimes severe, located in the inner portion of the knee. No swelling or bruising. Differential includes ligament or meniscus injury, which would require MRI or CT for diagnosis. X-ray will be performed to rule out bone injury. - Ordered x-ray of right knee- negative for evidence of acute fracture.  - Advised use of knee brace - Recommended alternating acetaminophen  and ibuprofen for pain management - Referred to orthopedics if symptoms do not improve  Hypertension Management requires medication refill. Previous provider in [place], [place] , now relocated to [place]. Atenolol  prescription to be sent to [facility] on [address]. - Sent atenolol  prescription to [facility] on [address] - Assisted in setting up with a new primary care provider    Discharge Instructions      I am still waiting on radiology to review your imaging but I do not see any obvious signs of an acute fracture or dislocation of your right knee.  We have supplied you with a knee brace to help with stabilization and I recommend that you alternate Tylenol  and ibuprofen as needed for pain control.  You can also use warm compresses and gentle massage to the area as needed to assist with pain.   If you feel like your symptoms are not improving or seem to be getting worse I recommend following up with orthopedics.  I have compiled the contact information for several orthopedic clinics in the area for you to utilize if you need.  A refill of your atenolol  has been sent into the pharmacy on file.  Please make sure that you establish and follow-up with a primary care provider for ongoing medication management and recommended lab work.  [facility] [address], [place], [place] [address] [phone]  [facility] [address] [place], [place] [address]  [phone]       ED Prescriptions     Medication Sig Dispense Auth. Provider   atenolol  (TENORMIN ) 50 MG tablet Take 1 tablet (50 mg total) by mouth daily. 30 tablet Gillie Fleites E, PA-C      PDMP not reviewed this encounter.   Marylene Rocky BRAVO, PA-C 11/09/24 9042

## 2024-11-07 ENCOUNTER — Ambulatory Visit: Payer: Self-pay

## 2024-12-05 ENCOUNTER — Ambulatory Visit (HOSPITAL_COMMUNITY)
Admission: RE | Admit: 2024-12-05 | Discharge: 2024-12-05 | Disposition: A | Discharge status: Home / Self Care | Source: Ambulatory Visit | Attending: Family Medicine | Admitting: Family Medicine

## 2024-12-05 ENCOUNTER — Encounter (HOSPITAL_COMMUNITY): Payer: Self-pay

## 2024-12-05 VITALS — BP 131/83 | HR 52 | Temp 98.1°F | Resp 14

## 2024-12-05 DIAGNOSIS — I10 Essential (primary) hypertension: Secondary | ICD-10-CM | POA: Diagnosis not present

## 2024-12-05 MED ORDER — ATENOLOL 50 MG PO TABS: 50.0000 mg | ORAL_TABLET | Freq: Every day | ORAL | 1 refills | Status: DC

## 2024-12-05 NOTE — ED Triage Notes (Signed)
 Pt needs refill of atenolol   I have made pcp appt for 12/18/2024

## 2024-12-05 NOTE — ED Provider Notes (Signed)
 " [facility]   [id number] 12/05/24 Arrival Time: 1009  ASSESSMENT & PLAN:  1. Hypertension, unspecified type    Has new PCP appt next month. Rx: Meds ordered this encounter  Medications   atenolol  (TENORMIN ) 50 MG tablet    Sig: Take 1 tablet (50 mg total) by mouth daily.    Dispense:  30 tablet    Refill:  1    Reviewed expectations re: course of current medical issues. Questions answered. Outlined signs and symptoms indicating need for more acute intervention. Patient verbalized understanding. After Visit Summary given.   SUBJECTIVE: History from: patient. Alan Cervantes is a 53 y.o. male who presents requesting medication refill - atenolol  50mg . No current concerns.  Current medical problems include: Past Medical History:  Diagnosis Date   Chronic joint pain    Hemorrhoids    History of kidney stones    Hypertension    Left ureteral stone    Thyroid disease     OBJECTIVE:  Vitals:   12/05/24 1052  BP: 131/83  Pulse: (!) 52  Resp: 14  Temp: 98.1 F (36.7 C)  TempSrc: Oral  SpO2: 95%    General appearance: alert; no distress Psychological: alert and cooperative; normal mood and affect  Labs:  Labs Reviewed - No data to display  Allergies[1]  Social History   Socioeconomic History   Marital status: Married    Spouse name: Not on file   Number of children: Not on file   Years of education: Not on file   Highest education level: Not on file  Occupational History   Not on file  Tobacco Use   Smoking status: Former    Types: Cigarettes   Smokeless tobacco: Current   Tobacco comments:    OCCASIONAL/ SOCIAL  SMOKER AND DIPS  Vaping Use   Vaping status: Never Used  Substance and Sexual Activity   Alcohol use: Yes   Drug use: No   Sexual activity: Yes  Other Topics Concern   Not on file  Social History Narrative   Not on file   Social Drivers of Health   Tobacco Use: High Risk (12/05/2024)   Patient History    Smoking  Tobacco Use: Former    Smokeless Tobacco Use: Current    Passive Exposure: Not on Actuary Strain: Not on file  Food Insecurity: Not on file  Transportation Needs: Not on file  Physical Activity: Not on file  Stress: Not on file  Social Connections: Not on file  Intimate Partner Violence: Not on file  Depression (PHQ2-9): Not on file  Alcohol Screen: Not on file  Housing: Not on file  Utilities: Not on file  Health Literacy: Not on file   Family History  Family history unknown: Yes   Past Surgical History:  Procedure Laterality Date   CYSTO/  BILATEARL RETROGRADE PYELOGRAM/  RIGHT URETEROSCOPIC LASER LITHOTRIPSY  STONE EXTRACTION/  RIGHT URETERAL STENT PLACEMENT  02/15/2008   CYSTO/  LEFT URETEROSCOPIC STONE EXTRACTION  05/01/2008   CYSTOSCOPY WITH URETEROSCOPY AND STENT PLACEMENT Left 05/13/2015   Procedure: CYSTOSCOPY WITH LEFT URETEROSCOPY AND STENT PLACEMENT;  Surgeon: Donnice Brooks, MD;  Location: [facility];  Service: Urology;  Laterality: Left;   EXTRACORPOREAL SHOCK WAVE LITHOTRIPSY Left 03/27/2015   FINGER SURGERY     HOLMIUM LASER APPLICATION Left 05/13/2015   Procedure: HOLMIUM LASER APPLICATION;  Surgeon: Donnice Brooks, MD;  Location: [facility];  Service: Urology;  Laterality: Left;  RECTAL PROLAPSE REPAIR, RECTOPEXY  12/13/2010       [1] No Known Allergies    Rolinda Rogue, MD 12/05/24 1306  "

## 2024-12-18 ENCOUNTER — Ambulatory Visit: Admitting: Sports Medicine

## 2024-12-25 ENCOUNTER — Encounter: Payer: Self-pay | Admitting: Sports Medicine

## 2024-12-25 ENCOUNTER — Ambulatory Visit: Admitting: Sports Medicine

## 2024-12-25 ENCOUNTER — Other Ambulatory Visit: Payer: Self-pay | Admitting: Sports Medicine

## 2024-12-25 VITALS — BP 126/80 | HR 53 | Temp 98.1°F | Ht 66.0 in | Wt 142.8 lb

## 2024-12-25 DIAGNOSIS — I10 Essential (primary) hypertension: Secondary | ICD-10-CM | POA: Diagnosis not present

## 2024-12-25 DIAGNOSIS — E039 Hypothyroidism, unspecified: Secondary | ICD-10-CM | POA: Diagnosis not present

## 2024-12-25 DIAGNOSIS — Z23 Encounter for immunization: Secondary | ICD-10-CM

## 2024-12-25 DIAGNOSIS — Z113 Encounter for screening for infections with a predominantly sexual mode of transmission: Secondary | ICD-10-CM

## 2024-12-25 DIAGNOSIS — E785 Hyperlipidemia, unspecified: Secondary | ICD-10-CM

## 2024-12-25 DIAGNOSIS — M25531 Pain in right wrist: Secondary | ICD-10-CM

## 2024-12-25 DIAGNOSIS — Z131 Encounter for screening for diabetes mellitus: Secondary | ICD-10-CM | POA: Diagnosis not present

## 2024-12-25 LAB — CBC WITH DIFFERENTIAL/PLATELET
Basophils Absolute: 0 K/uL (ref 0.0–0.1)
Basophils Relative: 0.8 % (ref 0.0–3.0)
Eosinophils Absolute: 0.2 K/uL (ref 0.0–0.7)
Eosinophils Relative: 3.9 % (ref 0.0–5.0)
HCT: 46.3 % (ref 39.0–52.0)
Hemoglobin: 15.6 g/dL (ref 13.0–17.0)
Lymphocytes Relative: 25.5 % (ref 12.0–46.0)
Lymphs Abs: 1.4 K/uL (ref 0.7–4.0)
MCHC: 33.6 g/dL (ref 30.0–36.0)
MCV: 92 fl (ref 78.0–100.0)
Monocytes Absolute: 0.3 K/uL (ref 0.1–1.0)
Monocytes Relative: 5.5 % (ref 3.0–12.0)
Neutro Abs: 3.6 K/uL (ref 1.4–7.7)
Neutrophils Relative %: 64.3 % (ref 43.0–77.0)
Platelets: 336 K/uL (ref 150.0–400.0)
RBC: 5.04 Mil/uL (ref 4.22–5.81)
RDW: 13.2 % (ref 11.5–15.5)
WBC: 5.7 K/uL (ref 4.0–10.5)

## 2024-12-25 LAB — LIPID PANEL
Cholesterol: 257 mg/dL — ABNORMAL HIGH (ref 28–200)
HDL: 33.6 mg/dL — ABNORMAL LOW
NonHDL: 223.21
Total CHOL/HDL Ratio: 8
Triglycerides: 818 mg/dL — ABNORMAL HIGH (ref 10.0–149.0)
VLDL: 163.6 mg/dL — ABNORMAL HIGH (ref 0.0–40.0)

## 2024-12-25 LAB — LDL CHOLESTEROL, DIRECT: Direct LDL: 113 mg/dL

## 2024-12-25 LAB — TSH: TSH: 5.77 u[IU]/mL — ABNORMAL HIGH (ref 0.35–5.50)

## 2024-12-25 LAB — HEMOGLOBIN A1C: Hgb A1c MFr Bld: 5.8 % (ref 4.6–6.5)

## 2024-12-25 MED ORDER — AMLODIPINE BESYLATE 5 MG PO TABS: 5.0000 mg | ORAL_TABLET | Freq: Every day | ORAL | 0 refills | Status: DC

## 2024-12-25 NOTE — Patient Instructions (Signed)
 Take atenolol  25 mg this week and starting next week 25mg  every other day and stop it  Start taking amlodipine  5 mg daily  Keep monitoring bp daily  and follow up in 3 weeks

## 2024-12-25 NOTE — Progress Notes (Signed)
 "  Careteam: Patient Care Team: Sherlynn Madden, MD as PCP - General (Internal Medicine)  Allergies[1]  Chief Complaint  Patient presents with   Establish Care   Medical Management of Chronic Issues    Has not had labs recently. Had colonoscopy in [place] last year screening normal    Discussed the use of AI scribe software for clinical note transcription with the patient, who gave verbal consent to proceed.  History of Present Illness  Alan Cervantes is a 54 year old male with hypertension who presents to establish care and for routine blood work.  He has a history of hypertension, currently managed with atenolol  50 mg. He experiences occasional dizziness and lightheadedness, particularly after playing soccer, and feels fatigued, which he attributes to poor sleep. He is unsure why atenolol  was initially prescribed. He was initially prescribed a low dose, which was later increased to 100 mg, but he self-adjusted to 50 mg based on his wife's suggestion. No chest pain, shortness of breath, or palpitations.  He has a history of high cholesterol and thyroid issues identified during blood work last year. He was on medication for these conditions for about two years but has since stopped taking them without medical advice. He is currently not on any medication for cholesterol or thyroid.  He has a history of a wrist fracture from playing soccer and experiences sharp pain in the wrist. 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 He had a surgery appointment scheduled but canceled it. He experiences significant pain, especially when lifting heavy objects.  No history of prostate problems, asthma, or heart problems. He reports no issues with urination, except occasional symptoms when not drinking enough water. He quit smoking three to five years ago after smoking for about two years. He drinks alcohol occasionally, about once a week. He is very sensitive to dust, which triggers symptoms, but denies  any history of stomach problems, heartburn, or acid reflux. He had a colonoscopy last year with no polyps found and was told he does not need another colonoscopy for ten years.    Review of Systems:  Review of Systems  Constitutional:  Negative for chills and fever.  HENT:  Negative for congestion and sore throat.   Eyes:  Negative for blurred vision.  Respiratory:  Negative for cough, sputum production and shortness of breath.   Cardiovascular:  Negative for chest pain, palpitations and leg swelling.  Gastrointestinal:  Negative for abdominal pain, heartburn and nausea.  Genitourinary:  Negative for dysuria, frequency and hematuria.  Musculoskeletal:  Positive for joint pain. Negative for falls and myalgias.  Neurological:  Negative for dizziness, sensory change and focal weakness.   Negative unless indicated in HPI.   Patient Active Problem List   Diagnosis Date Noted   Closed fracture of proximal pole of scaphoid 10/10/2024   MUSCLE WEAKNESS (GENERALIZED) 04/19/2008   RECTAL PROLAPSE 03/19/2008   SHOULDER PAIN, LEFT 03/19/2008   WRIST PAIN, RIGHT 03/19/2008   Hemorrhoids 12/21/2007   Calculus of kidney 12/21/2007   Past Medical History:  Diagnosis Date   Chronic joint pain    Hemorrhoids    History of kidney stones    Hypertension    Left ureteral stone    Thyroid disease    Past Surgical History:  Procedure Laterality Date   CYSTO/  BILATEARL RETROGRADE PYELOGRAM/  RIGHT URETEROSCOPIC LASER LITHOTRIPSY  STONE EXTRACTION/  RIGHT URETERAL STENT PLACEMENT  02/15/2008   CYSTO/  LEFT URETEROSCOPIC STONE EXTRACTION  05/01/2008  CYSTOSCOPY WITH URETEROSCOPY AND STENT PLACEMENT Left 05/13/2015   Procedure: CYSTOSCOPY WITH LEFT URETEROSCOPY AND STENT PLACEMENT;  Surgeon: Donnice Brooks, MD;  Location: [facility];  Service: Urology;  Laterality: Left;   EXTRACORPOREAL SHOCK WAVE LITHOTRIPSY Left 03/27/2015   FINGER SURGERY     HOLMIUM LASER APPLICATION Left  05/13/2015   Procedure: HOLMIUM LASER APPLICATION;  Surgeon: Donnice Brooks, MD;  Location: [facility];  Service: Urology;  Laterality: Left;   RECTAL PROLAPSE REPAIR, RECTOPEXY  12/13/2010   Social History[2] Family History  Family history unknown: Yes   Allergies[3]  Medications: Patient's Medications  New Prescriptions   AMLODIPINE  (NORVASC ) 5 MG TABLET    Take 1 tablet (5 mg total) by mouth daily.  Previous Medications   ATENOLOL  (TENORMIN ) 50 MG TABLET    Take 1 tablet (50 mg total) by mouth daily.   ATORVASTATIN (LIPITOR) 40 MG TABLET    Take 40 mg by mouth daily.   LEVOTHYROXINE  (SYNTHROID ) 25 MCG TABLET    Take 25 mcg by mouth daily before breakfast.   LEVOTHYROXINE  (SYNTHROID ) 50 MCG TABLET    Take 50 mcg by mouth daily.  Modified Medications   No medications on file  Discontinued Medications   TAMSULOSIN  (FLOMAX ) 0.4 MG CAPS CAPSULE    Take 1 capsule (0.4 mg total) by mouth daily after supper.    Physical Exam: Vitals:   12/25/24 0815  BP: 126/80  Pulse: (!) 53  Temp: 98.1 F (36.7 C)  SpO2: 94%  Weight: 142 lb 12.8 oz (64.8 kg)  Height: 5' 6 (1.676 m)   Body mass index is 23.05 kg/m. BP Readings from Last 3 Encounters:  12/25/24 126/80  12/05/24 131/83  11/06/24 119/80   Wt Readings from Last 3 Encounters:  12/25/24 142 lb 12.8 oz (64.8 kg)  09/25/24 140 lb (63.5 kg)  09/20/24 145 lb (65.8 kg)    Physical Exam Constitutional:      Appearance: Normal appearance.  HENT:     Head: Normocephalic and atraumatic.     Mouth/Throat:     Pharynx: No posterior oropharyngeal erythema.  Cardiovascular:     Rate and Rhythm: Normal rate and regular rhythm.     Pulses: Normal pulses.     Heart sounds: Normal heart sounds.  Pulmonary:     Effort: No respiratory distress.     Breath sounds: No stridor. No wheezing or rales.  Abdominal:     General: Bowel sounds are normal. There is no distension.     Palpations: Abdomen is soft.      Tenderness: There is no abdominal tenderness. There is no guarding.  Musculoskeletal:        General: No swelling.  Lymphadenopathy:     Cervical: No cervical adenopathy.  Neurological:     Mental Status: He is alert. Mental status is at baseline.     Sensory: No sensory deficit.     Motor: No weakness.     Labs reviewed: Basic Metabolic Panel: No results for input(s): NA, K, CL, CO2, GLUCOSE, BUN, CREATININE, CALCIUM, MG, PHOS, TSH in the last 8760 hours. Liver Function Tests: No results for input(s): AST, ALT, ALKPHOS, BILITOT, PROT, ALBUMIN in the last 8760 hours. No results for input(s): LIPASE, AMYLASE in the last 8760 hours. No results for input(s): AMMONIA in the last 8760 hours. CBC: No results for input(s): WBC, NEUTROABS, HGB, HCT, MCV, PLT in the last 8760 hours. Lipid Panel: No results for input(s): CHOL, HDL, LDLCALC, TRIG,  CHOLHDL, LDLDIRECT in the last 8760 hours. TSH: No results for input(s): TSH in the last 8760 hours. A1C: No results found for: HGBA1C  Assessment & Plan Primary hypertension Pt c/o feeling fatigue HR on the lower side Denies chest pain, palpitations, SOB Will switch atenolol  to amlodipine   Will taper atenolol  slowly, instructions provided Monitor bp daily and keep a log Avoid salty foods Exercise regularly  Orders:   Flu vaccine trivalent PF, 6mos and older(Flulaval,Afluria,Fluarix,Fluzone)   CBC with Differential/Platelet   COMPLETE METABOLIC PANEL WITHOUT GFR   amLODipine  (NORVASC ) 5 MG tablet; Take 1 tablet (5 mg total) by mouth daily.  Acquired hypothyroidism Not taking thyroid meds Orders:   TSH  Hyperlipidemia, unspecified hyperlipidemia type  Orders:   Lipid panel  Screening for diabetes mellitus  Orders:   Hemoglobin A1c  Immunization due Flu today    Screening for STD (sexually transmitted disease)  Orders:   Hepatitis C  Antibody  Arthralgia of right forearm Chronic Pt reports difficulty lifting heavy objects due to pain Follow up with ortho     Return in about 3 weeks (around 01/15/2025).:   Alan Cervantes     [1] No Known Allergies [2]  Social History Tobacco Use   Smoking status: Former    Types: Cigarettes   Smokeless tobacco: Current   Tobacco comments:    OCCASIONAL/ SOCIAL  SMOKER AND DIPS    Smoked for 2 yrs  Vaping Use   Vaping status: Never Used  Substance Use Topics   Alcohol use: Yes    Comment: once a week   Drug use: No  [3] No Known Allergies  "

## 2024-12-25 NOTE — Assessment & Plan Note (Signed)
 Chronic Pt reports difficulty lifting heavy objects due to pain Follow up with ortho

## 2024-12-26 ENCOUNTER — Ambulatory Visit: Payer: Self-pay | Admitting: Sports Medicine

## 2024-12-26 ENCOUNTER — Other Ambulatory Visit: Payer: Self-pay | Admitting: Sports Medicine

## 2024-12-26 DIAGNOSIS — E039 Hypothyroidism, unspecified: Secondary | ICD-10-CM

## 2024-12-26 LAB — HEPATITIS C ANTIBODY: Hepatitis C Ab: NONREACTIVE

## 2024-12-26 LAB — COMPLETE METABOLIC PANEL WITHOUT GFR
AG Ratio: 1.5 (calc) (ref 1.0–2.5)
ALT: 27 U/L (ref 9–46)
AST: 21 U/L (ref 10–35)
Albumin: 4.6 g/dL (ref 3.6–5.1)
Alkaline phosphatase (APISO): 61 U/L (ref 35–144)
BUN: 19 mg/dL (ref 7–25)
CO2: 27 mmol/L (ref 20–32)
Calcium: 9.5 mg/dL (ref 8.6–10.3)
Chloride: 105 mmol/L (ref 98–110)
Creat: 1.15 mg/dL (ref 0.70–1.30)
Globulin: 3 g/dL (ref 1.9–3.7)
Glucose, Bld: 97 mg/dL (ref 65–99)
Potassium: 4.2 mmol/L (ref 3.5–5.3)
Sodium: 140 mmol/L (ref 135–146)
Total Bilirubin: 0.6 mg/dL (ref 0.2–1.2)
Total Protein: 7.6 g/dL (ref 6.1–8.1)

## 2024-12-26 MED ORDER — LEVOTHYROXINE SODIUM 25 MCG PO TABS: 25.0000 ug | ORAL_TABLET | Freq: Every day | ORAL | 0 refills | Status: DC

## 2024-12-29 ENCOUNTER — Other Ambulatory Visit: Payer: Self-pay | Admitting: Sports Medicine

## 2024-12-29 DIAGNOSIS — E039 Hypothyroidism, unspecified: Secondary | ICD-10-CM

## 2025-01-15 ENCOUNTER — Encounter: Payer: Self-pay | Admitting: Sports Medicine

## 2025-01-15 ENCOUNTER — Ambulatory Visit: Admitting: Sports Medicine

## 2025-01-15 VITALS — BP 111/73 | HR 60 | Temp 98.1°F | Wt 146.1 lb

## 2025-01-15 DIAGNOSIS — E782 Mixed hyperlipidemia: Secondary | ICD-10-CM

## 2025-01-15 DIAGNOSIS — Z23 Encounter for immunization: Secondary | ICD-10-CM

## 2025-01-15 DIAGNOSIS — E039 Hypothyroidism, unspecified: Secondary | ICD-10-CM

## 2025-01-15 DIAGNOSIS — I10 Essential (primary) hypertension: Secondary | ICD-10-CM

## 2025-01-15 DIAGNOSIS — R053 Chronic cough: Secondary | ICD-10-CM

## 2025-01-15 MED ORDER — ATENOLOL 25 MG PO TABS: 25.0000 mg | ORAL_TABLET | Freq: Every day | ORAL | 0 refills | Status: AC

## 2025-01-15 MED ORDER — FLUTICASONE PROPIONATE 50 MCG/ACT NA SUSP: 2.0000 | Freq: Every day | NASAL | 6 refills | Status: AC

## 2025-01-15 MED ORDER — LEVOTHYROXINE SODIUM 25 MCG PO TABS: 25.0000 ug | ORAL_TABLET | Freq: Every day | ORAL | 0 refills | Status: AC

## 2025-01-15 MED ORDER — LORATADINE 10 MG PO TABS: 10.0000 mg | ORAL_TABLET | Freq: Every day | ORAL | 11 refills | Status: AC

## 2025-01-15 MED ORDER — ATORVASTATIN CALCIUM 40 MG PO TABS: 40.0000 mg | ORAL_TABLET | Freq: Every day | ORAL | 0 refills | Status: AC

## 2025-01-15 NOTE — Patient Instructions (Addendum)
 Take atenolol  25 mg every other day until you finish the atenolol  and stop atenolol  Cont with amlodipine  Start taking lipitor Start taking levothyroxine   Come for repeat TSH in 6 weeks

## 2025-02-05 ENCOUNTER — Other Ambulatory Visit

## 2025-02-26 ENCOUNTER — Other Ambulatory Visit

## 2025-04-15 ENCOUNTER — Ambulatory Visit: Admitting: Sports Medicine
